# Patient Record
Sex: Female | Born: 1960 | Hispanic: Yes | Marital: Married | Smoking: Never smoker
Health system: Southern US, Community
[De-identification: ages and names within clinical notes are randomized; demographics above are authoritative.]

## PROBLEM LIST (undated history)

## (undated) DIAGNOSIS — I10 Essential (primary) hypertension: Secondary | ICD-10-CM

## (undated) DIAGNOSIS — M1990 Unspecified osteoarthritis, unspecified site: Secondary | ICD-10-CM

## (undated) DIAGNOSIS — E079 Disorder of thyroid, unspecified: Secondary | ICD-10-CM

## (undated) DIAGNOSIS — H6690 Otitis media, unspecified, unspecified ear: Secondary | ICD-10-CM

## (undated) HISTORY — DX: Unspecified osteoarthritis, unspecified site: M19.90

## (undated) HISTORY — PX: BREAST EXCISIONAL BIOPSY: SUR124

## (undated) HISTORY — DX: Essential (primary) hypertension: I10

## (undated) HISTORY — PX: KNEE SURGERY: SHX244

## (undated) HISTORY — DX: Otitis media, unspecified, unspecified ear: H66.90

## (undated) HISTORY — DX: Disorder of thyroid, unspecified: E07.9

---

## 2001-07-14 ENCOUNTER — Other Ambulatory Visit: Admission: RE | Admit: 2001-07-14 | Discharge: 2001-07-14 | Payer: Self-pay | Admitting: *Deleted

## 2002-07-06 ENCOUNTER — Other Ambulatory Visit: Admission: RE | Admit: 2002-07-06 | Discharge: 2002-07-06 | Payer: Self-pay | Admitting: *Deleted

## 2003-09-26 ENCOUNTER — Other Ambulatory Visit: Admission: RE | Admit: 2003-09-26 | Discharge: 2003-09-26 | Payer: Self-pay | Admitting: *Deleted

## 2003-10-13 ENCOUNTER — Encounter: Admission: RE | Admit: 2003-10-13 | Discharge: 2003-10-13 | Payer: Self-pay | Admitting: Internal Medicine

## 2004-11-14 ENCOUNTER — Encounter: Admission: RE | Admit: 2004-11-14 | Discharge: 2004-11-14 | Payer: Self-pay | Admitting: Internal Medicine

## 2006-01-02 ENCOUNTER — Encounter: Admission: RE | Admit: 2006-01-02 | Discharge: 2006-01-02 | Payer: Self-pay | Admitting: Internal Medicine

## 2007-03-16 ENCOUNTER — Encounter: Admission: RE | Admit: 2007-03-16 | Discharge: 2007-03-16 | Payer: Self-pay | Admitting: Internal Medicine

## 2007-03-17 ENCOUNTER — Ambulatory Visit: Payer: Self-pay | Admitting: Internal Medicine

## 2007-03-25 ENCOUNTER — Encounter: Admission: RE | Admit: 2007-03-25 | Discharge: 2007-03-25 | Payer: Self-pay | Admitting: Internal Medicine

## 2008-03-18 ENCOUNTER — Encounter: Admission: RE | Admit: 2008-03-18 | Discharge: 2008-03-18 | Payer: Self-pay | Admitting: Internal Medicine

## 2008-06-24 HISTORY — PX: JOINT REPLACEMENT: SHX530

## 2009-03-20 ENCOUNTER — Encounter: Admission: RE | Admit: 2009-03-20 | Discharge: 2009-03-20 | Payer: Self-pay | Admitting: Internal Medicine

## 2009-05-08 ENCOUNTER — Ambulatory Visit: Payer: Self-pay | Admitting: Gastroenterology

## 2010-03-29 ENCOUNTER — Encounter: Admission: RE | Admit: 2010-03-29 | Discharge: 2010-03-29 | Payer: Self-pay | Admitting: Internal Medicine

## 2010-07-16 ENCOUNTER — Encounter: Payer: Self-pay | Admitting: Internal Medicine

## 2011-01-09 ENCOUNTER — Other Ambulatory Visit: Payer: Self-pay | Admitting: Obstetrics and Gynecology

## 2011-01-09 DIAGNOSIS — R928 Other abnormal and inconclusive findings on diagnostic imaging of breast: Secondary | ICD-10-CM

## 2011-01-14 ENCOUNTER — Ambulatory Visit
Admission: RE | Admit: 2011-01-14 | Discharge: 2011-01-14 | Disposition: A | Discharge status: Home / Self Care | Payer: BC Managed Care – PPO | Source: Ambulatory Visit | Attending: Obstetrics and Gynecology | Admitting: Obstetrics and Gynecology

## 2011-01-14 ENCOUNTER — Other Ambulatory Visit: Payer: Self-pay | Admitting: Obstetrics and Gynecology

## 2011-01-14 DIAGNOSIS — R928 Other abnormal and inconclusive findings on diagnostic imaging of breast: Secondary | ICD-10-CM

## 2011-01-17 ENCOUNTER — Other Ambulatory Visit: Payer: Self-pay | Admitting: Obstetrics and Gynecology

## 2011-01-17 ENCOUNTER — Ambulatory Visit
Admission: RE | Admit: 2011-01-17 | Discharge: 2011-01-17 | Disposition: A | Discharge status: Home / Self Care | Payer: BC Managed Care – PPO | Source: Ambulatory Visit | Attending: Obstetrics and Gynecology | Admitting: Obstetrics and Gynecology

## 2011-01-17 DIAGNOSIS — R928 Other abnormal and inconclusive findings on diagnostic imaging of breast: Secondary | ICD-10-CM

## 2011-01-18 ENCOUNTER — Ambulatory Visit
Admission: RE | Admit: 2011-01-18 | Discharge: 2011-01-18 | Disposition: A | Discharge status: Home / Self Care | Payer: BC Managed Care – PPO | Source: Ambulatory Visit | Attending: Obstetrics and Gynecology | Admitting: Obstetrics and Gynecology

## 2011-01-18 DIAGNOSIS — R928 Other abnormal and inconclusive findings on diagnostic imaging of breast: Secondary | ICD-10-CM

## 2011-01-29 ENCOUNTER — Encounter (INDEPENDENT_AMBULATORY_CARE_PROVIDER_SITE_OTHER): Payer: Self-pay | Admitting: Surgery

## 2011-01-29 ENCOUNTER — Other Ambulatory Visit (INDEPENDENT_AMBULATORY_CARE_PROVIDER_SITE_OTHER): Payer: Self-pay | Admitting: Surgery

## 2011-01-29 ENCOUNTER — Ambulatory Visit (INDEPENDENT_AMBULATORY_CARE_PROVIDER_SITE_OTHER): Payer: BC Managed Care – PPO | Admitting: Surgery

## 2011-01-29 VITALS — BP 158/110 | HR 70 | Temp 97.6°F | Ht 63.0 in | Wt 186.6 lb

## 2011-01-29 DIAGNOSIS — N6099 Unspecified benign mammary dysplasia of unspecified breast: Secondary | ICD-10-CM

## 2011-01-29 DIAGNOSIS — R921 Mammographic calcification found on diagnostic imaging of breast: Secondary | ICD-10-CM

## 2011-01-29 DIAGNOSIS — N6089 Other benign mammary dysplasias of unspecified breast: Secondary | ICD-10-CM

## 2011-01-29 NOTE — Progress Notes (Signed)
Julia Diaz is a 50 y.o. female.    Chief Complaint  Patient presents with  . 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 I am asked to consider her for excision of this area. She has no complaints. She has had no previous history of breast biopsies or breast cancer. She denies nipple discharge. Her family history is negative for breast cancer Past Medical History  Diagnosis Date  . Hypertension   . Thyroid disease   . Arthritis   . Ear infection     Past Surgical History  Procedure Date  . Knee surgery     right    History reviewed. No pertinent family history.  Social History History  Substance Use Topics  . Smoking status: Never Smoker   . Smokeless tobacco: Not on file  . Alcohol Use: Yes     socially    Allergies  Allergen Reactions  . Aspirin Swelling    Face, eyes. Patient does not remember if breathing was affected.    Current Outpatient Prescriptions  Medication Sig Dispense Refill  . levothyroxine (SYNTHROID, LEVOTHROID) 75 MCG tablet Take 75 mcg by mouth daily.          Review of Systems ROS A 16 point review of systems was reviewed with the patient and her daughter. It is negative from a cardiopulmonary standpoint and is otherwise generally negative as well.Physical Exam Physical Exam  Constitutional: She is oriented to person, place, and time. She appears well-developed and well-nourished. No distress.  HENT:  Head: Normocephalic and atraumatic.  Right Ear: External ear normal.  Left Ear: External ear normal.  Nose: Nose normal.  Mouth/Throat: Oropharynx is clear and moist. No oropharyngeal exudate.  Eyes: Conjunctivae are normal. Pupils are equal, round, and reactive to light.  Neck: Normal range of motion. Neck supple. No tracheal deviation present. No thyromegaly present.  Cardiovascular: Normal  rate, regular rhythm, normal heart sounds and intact distal pulses.   No murmur heard. Respiratory: Effort normal and breath sounds normal. She has no wheezes.  GI: She exhibits no distension. There is no tenderness.  Musculoskeletal: Normal range of motion. She exhibits no edema and no tenderness.  Lymphadenopathy:    She has no axillary adenopathy.       Right axillary: No lateral adenopathy present.       Left axillary: No lateral adenopathy present. Neurological: She is alert and oriented to person, place, and time.  Skin: Skin is warm and dry. No rash noted. No erythema.  Psychiatric: Her behavior is normal. Judgment normal.    On breast exam, there are no palpable masses in either breast. Her biopsy site in the left breast is mildly bruised.  Pathology results showed atypical ductal hyperplasia and microcalcifications Blood pressure 158/110, pulse 70, temperature 97.6 F (36.4 C), temperature source Temporal, height 5\' 3"  (1.6 m), weight 186 lb 9.6 oz (84.641 kg).  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 Needle localized lumpectomy is recommended of this area to rule out malignancy. I discussed the risk of surgery which include but not limited to bleeding, infection, injury to surrounding structures, findings of malignancy with need for further surgery, et Karie Soda. Surgery will reschedule. Kaizer Dissinger A 01/29/2011, 10:57 AM

## 2011-02-11 ENCOUNTER — Encounter (HOSPITAL_COMMUNITY)
Admission: RE | Admit: 2011-02-11 | Discharge: 2011-02-11 | Disposition: A | Discharge status: Home / Self Care | Payer: BC Managed Care – PPO | Source: Ambulatory Visit | Attending: Surgery | Admitting: Surgery

## 2011-02-11 LAB — BASIC METABOLIC PANEL
BUN: 16 mg/dL (ref 6–23)
Calcium: 9.8 mg/dL (ref 8.4–10.5)
Creatinine, Ser: 0.66 mg/dL (ref 0.50–1.10)
GFR calc Af Amer: >60 mL/min (ref >60)
GFR calc non Af Amer: >60 mL/min (ref >60)
Glucose, Bld: 103 mg/dL — ABNORMAL HIGH (ref 70–99)
Potassium: 4 mEq/L (ref 3.5–5.1)

## 2011-02-11 LAB — CBC
HCT: 40.8 % (ref 36.0–46.0)
Hemoglobin: 14 g/dL (ref 12.0–15.0)
MCH: 31 pg (ref 26.0–34.0)
MCHC: 34.3 g/dL (ref 30.0–36.0)
RDW: 12.7 % (ref 11.5–15.5)

## 2011-02-22 ENCOUNTER — Ambulatory Visit
Admission: RE | Admit: 2011-02-22 | Discharge: 2011-02-22 | Disposition: A | Discharge status: Home / Self Care | Payer: BC Managed Care – PPO | Source: Ambulatory Visit | Attending: Surgery | Admitting: Surgery

## 2011-02-22 ENCOUNTER — Ambulatory Visit (HOSPITAL_COMMUNITY)
Admission: RE | Admit: 2011-02-22 | Discharge: 2011-02-22 | Disposition: A | Discharge status: Home / Self Care | Payer: BC Managed Care – PPO | Source: Ambulatory Visit | Attending: Surgery | Admitting: Surgery

## 2011-02-22 ENCOUNTER — Inpatient Hospital Stay: Admission: RE | Admit: 2011-02-22 | Payer: BC Managed Care – PPO | Source: Ambulatory Visit

## 2011-02-22 ENCOUNTER — Other Ambulatory Visit (INDEPENDENT_AMBULATORY_CARE_PROVIDER_SITE_OTHER): Payer: Self-pay | Admitting: Surgery

## 2011-02-22 DIAGNOSIS — R920 Mammographic microcalcification found on diagnostic imaging of breast: Secondary | ICD-10-CM

## 2011-02-22 DIAGNOSIS — N6089 Other benign mammary dysplasias of unspecified breast: Secondary | ICD-10-CM | POA: Insufficient documentation

## 2011-02-22 DIAGNOSIS — R921 Mammographic calcification found on diagnostic imaging of breast: Secondary | ICD-10-CM

## 2011-02-22 DIAGNOSIS — Z01812 Encounter for preprocedural laboratory examination: Secondary | ICD-10-CM | POA: Insufficient documentation

## 2011-02-22 HISTORY — PX: BREAST SURGERY: SHX581

## 2011-02-26 ENCOUNTER — Telehealth (INDEPENDENT_AMBULATORY_CARE_PROVIDER_SITE_OTHER): Payer: Self-pay | Admitting: Surgery

## 2011-03-01 NOTE — Op Note (Signed)
  NAMEIDALIA, ALLBRITTON NO.:  [id number]  MEDICAL RECORD NO.:  [id number]  LOCATION:                                 FACILITY:  PHYSICIAN:  Abigail Miyamoto, M.D. DATE OF BIRTH:  Nov 11, 1960  DATE OF PROCEDURE:  02/22/2011 DATE OF DISCHARGE:                              OPERATIVE REPORT   PREOPERATIVE DIAGNOSIS:  Abnormal microcalcifications with atypical ductal hyperplasia of the left breast.  POSTOPERATIVE DIAGNOSIS:  Abnormal microcalcifications with atypical ductal hyperplasia of the left breast.  PROCEDURE:  Needle-localized left breast biopsy.  SURGEON:  Abigail Miyamoto, MD  ANESTHESIA:  General and 0.25% Marcaine.  ESTIMATED BLOOD LOSS:  Minimal.  INDICATIONS:  This is a 50 year old female who presented with abnormal microcalcifications of the left breast.  Stereotactic biopsy showed atypical ductal hyperplasia.  Decision was made to proceed with needle- localized removal of this area.  PROCEDURE IN DETAIL:  The patient was brought to the operating room and identified as Marilu Simao.  She was placed supine on the operating table and anesthesia was induced.  She had already had needle localization wire placed in the left breast.  Her left breast was then prepped and draped in usual sterile fashion.  I made a small incision at 12 o'clock position of the left breast incorporating the localization wire.  I then took this down to the breast tissue with electrocautery. I then performed an excision of the surrounding breast tissue around localization wire going down to the chest wall.  Once the mass was completely removed, it was x-rayed in the suspicious area and clipped and confirmed to be in the biopsy specimen.  I then irrigated the wound with saline and achieved hemostasis with cautery.  I anesthetized the area with Marcaine and closed the incision with interrupted 3-0 Vicryl sutures and a running 4-0 Monocryl.  Steri-Strips, gauze and  Tegaderm were then applied.  The patient tolerated the procedure well.  All counts were correct at the end of the procedure.  The patient was then extubated in the operating room and taken in stable condition to recovery room.     Abigail Miyamoto, M.D.     DB/MEDQ  D:  02/22/2011  T:  02/22/2011  Job:  [id number]  Electronically Signed by Abigail Miyamoto M.D. on 03/01/2011 12:37:23 PM

## 2011-03-08 ENCOUNTER — Encounter (INDEPENDENT_AMBULATORY_CARE_PROVIDER_SITE_OTHER): Payer: Self-pay | Admitting: Surgery

## 2011-03-08 ENCOUNTER — Ambulatory Visit (INDEPENDENT_AMBULATORY_CARE_PROVIDER_SITE_OTHER): Payer: BC Managed Care – PPO | Admitting: Surgery

## 2011-03-08 VITALS — BP 152/94 | HR 72

## 2011-03-08 DIAGNOSIS — Z09 Encounter for follow-up examination after completed treatment for conditions other than malignant neoplasm: Secondary | ICD-10-CM

## 2011-03-08 NOTE — Progress Notes (Signed)
Subjective:     Patient ID: Julia Diaz, female   DOB: 1960-08-14, 50 y.o.   MRN: [id number]  HPI She is here for her first postoperative visit status post excision of a left breast mass. She has no complaints.  Review of Systems     Objective:   Physical Exam On exam the incision is healing well without evidence of infection.  The final pathology showed no evidence of malignancy and a biopsy specimen from the previous atypical area.    Assessment:     Patient status post excision of a benign breast mass after atypical cells found on stereotactic biopsy    Plan:     I reviewed the pathology with her. She will continue her self examinations and yearly mammograms. I will see her back as needed.

## 2011-05-11 ENCOUNTER — Emergency Department: Payer: Self-pay | Admitting: Unknown Physician Specialty

## 2011-05-14 ENCOUNTER — Other Ambulatory Visit: Payer: Self-pay | Admitting: Internal Medicine

## 2011-05-17 ENCOUNTER — Other Ambulatory Visit: Payer: Self-pay | Admitting: Internal Medicine

## 2011-05-17 DIAGNOSIS — N63 Unspecified lump in unspecified breast: Secondary | ICD-10-CM

## 2011-05-20 ENCOUNTER — Ambulatory Visit
Admission: RE | Admit: 2011-05-20 | Discharge: 2011-05-20 | Disposition: A | Discharge status: Home / Self Care | Payer: BC Managed Care – PPO | Source: Ambulatory Visit | Attending: Internal Medicine | Admitting: Internal Medicine

## 2011-05-20 ENCOUNTER — Other Ambulatory Visit: Payer: Self-pay | Admitting: Internal Medicine

## 2011-05-20 DIAGNOSIS — N63 Unspecified lump in unspecified breast: Secondary | ICD-10-CM

## 2012-01-16 ENCOUNTER — Other Ambulatory Visit: Payer: Self-pay | Admitting: Internal Medicine

## 2012-01-16 DIAGNOSIS — Z1231 Encounter for screening mammogram for malignant neoplasm of breast: Secondary | ICD-10-CM

## 2012-02-18 ENCOUNTER — Ambulatory Visit
Admission: RE | Admit: 2012-02-18 | Discharge: 2012-02-18 | Disposition: A | Discharge status: Home / Self Care | Payer: 59 | Source: Ambulatory Visit | Attending: Internal Medicine | Admitting: Internal Medicine

## 2012-02-18 ENCOUNTER — Ambulatory Visit: Payer: BC Managed Care – PPO

## 2012-02-18 DIAGNOSIS — Z1231 Encounter for screening mammogram for malignant neoplasm of breast: Secondary | ICD-10-CM

## 2013-01-07 ENCOUNTER — Other Ambulatory Visit: Payer: Self-pay

## 2013-01-07 DIAGNOSIS — Z1231 Encounter for screening mammogram for malignant neoplasm of breast: Secondary | ICD-10-CM

## 2013-01-29 ENCOUNTER — Ambulatory Visit
Admission: RE | Admit: 2013-01-29 | Discharge: 2013-01-29 | Disposition: A | Discharge status: Home / Self Care | Payer: 59 | Source: Ambulatory Visit

## 2013-01-29 DIAGNOSIS — Z1231 Encounter for screening mammogram for malignant neoplasm of breast: Secondary | ICD-10-CM

## 2013-03-07 IMAGING — MG MM DIGITAL DIAGNOSTIC UNILAT L {BCG}
4 series · 4 of 4 positions shown · non-contrast
Comparison: With priors

CLINICAL DATA: Abnormal left screening mammogram

DIGITAL DIAGNOSTIC LEFT MAMMOGRAM

[L CC]
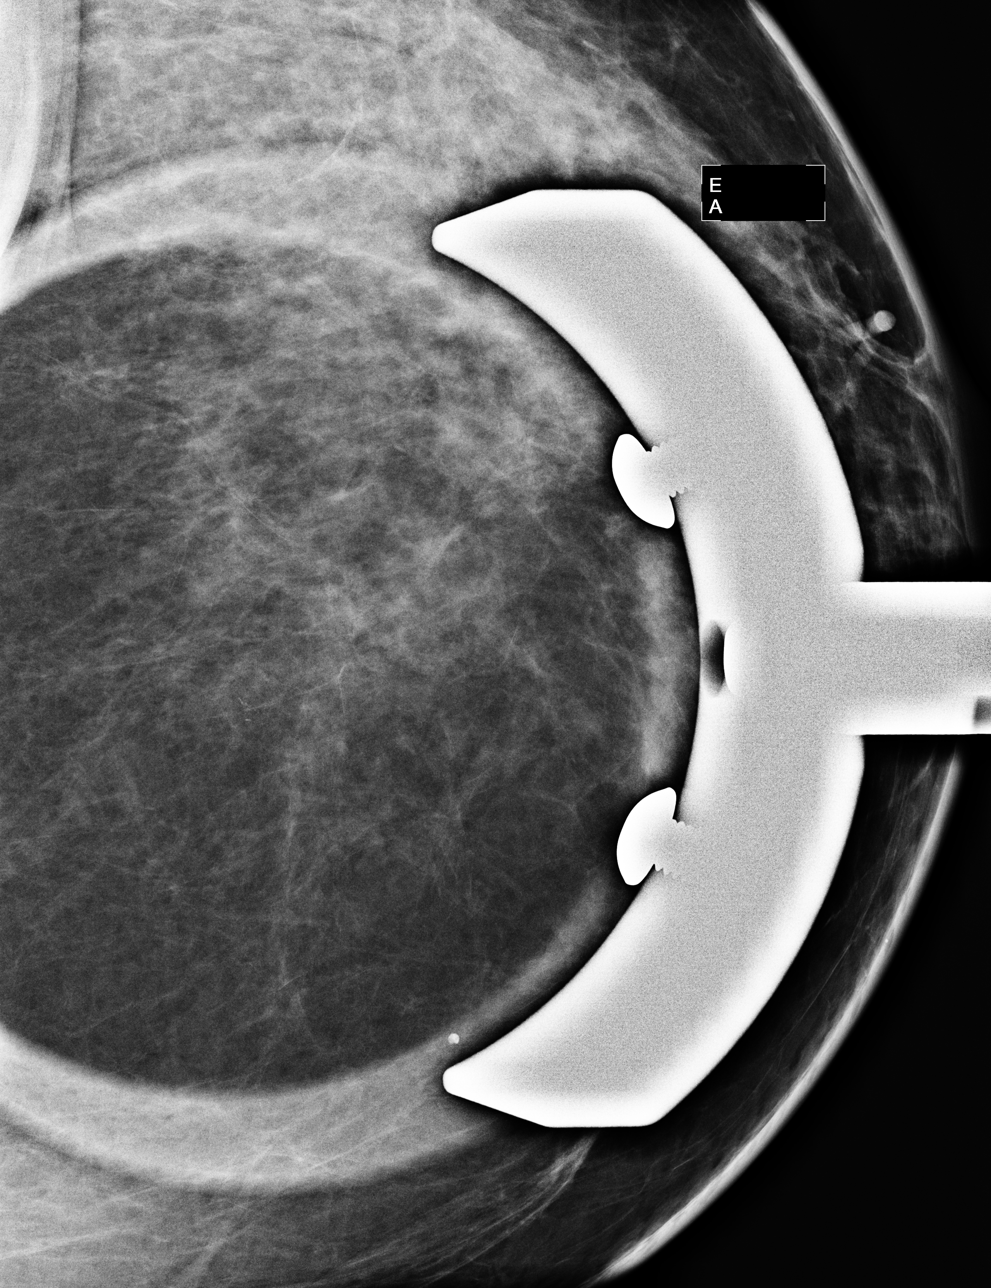

[L ML (1 of 3)]
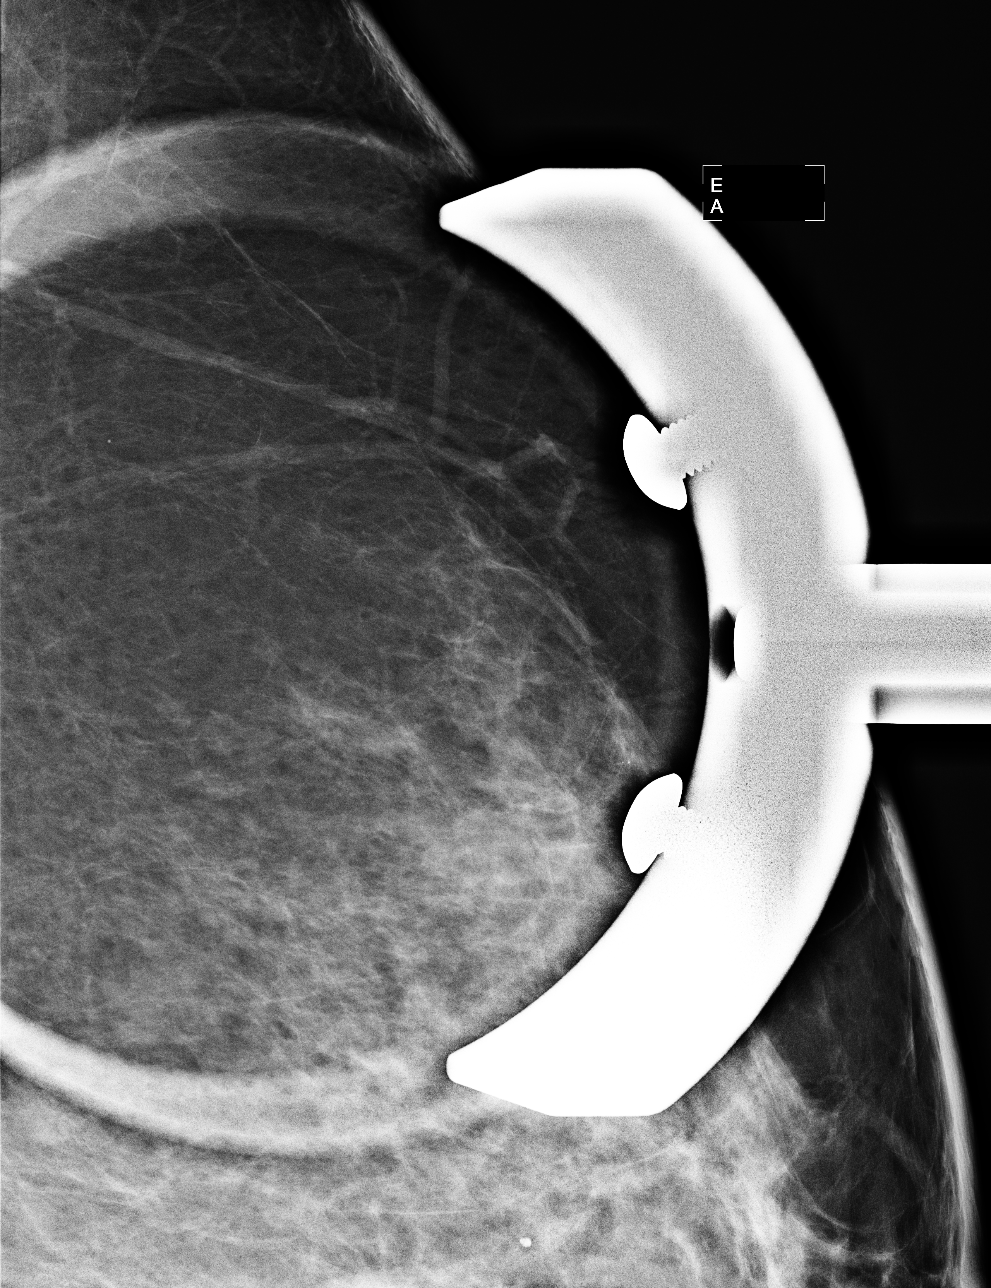

[L ML (2 of 3)]
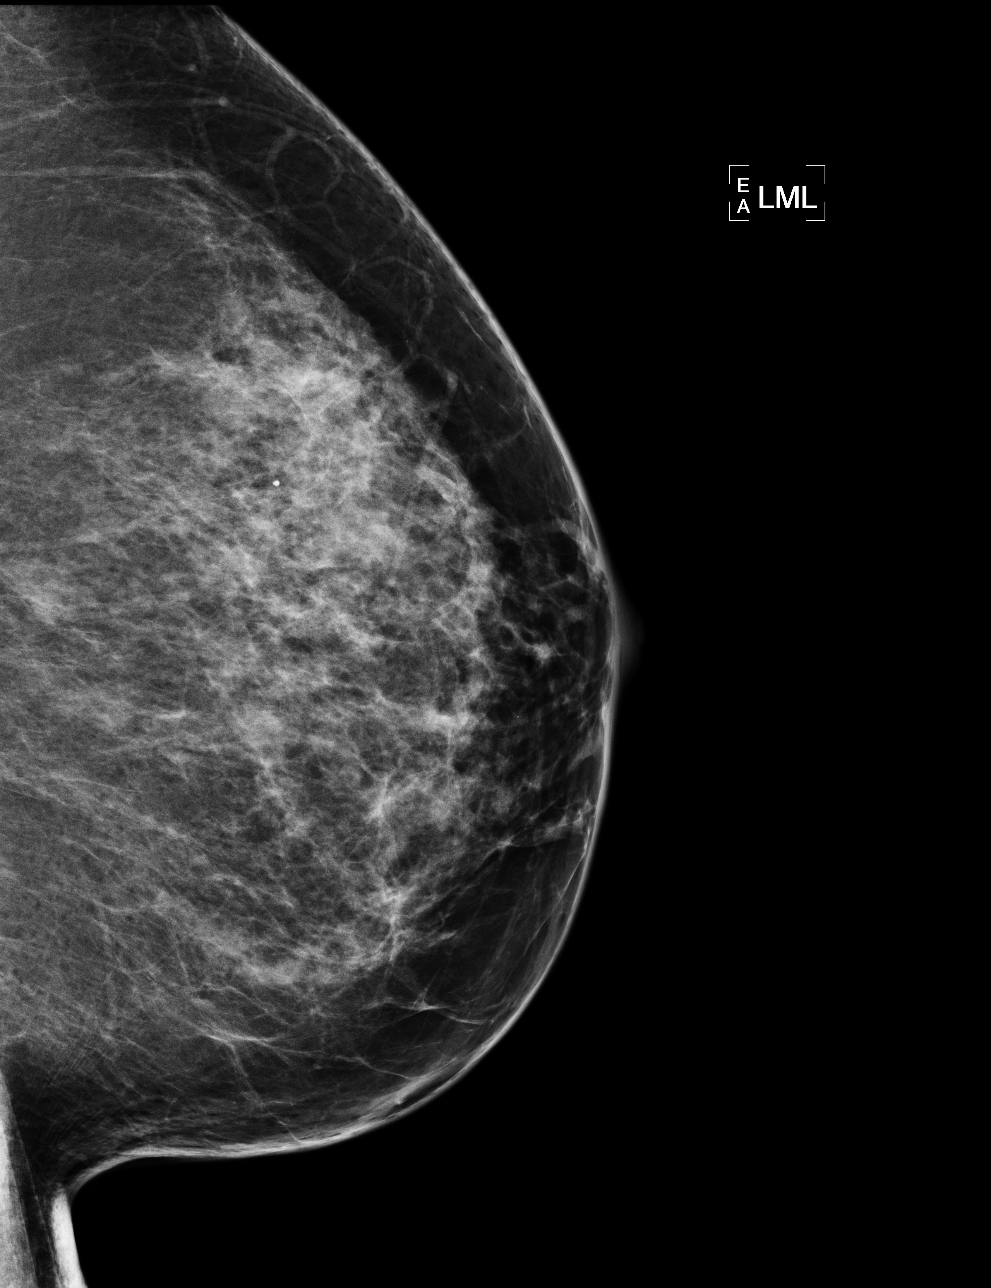

[L ML (3 of 3)]
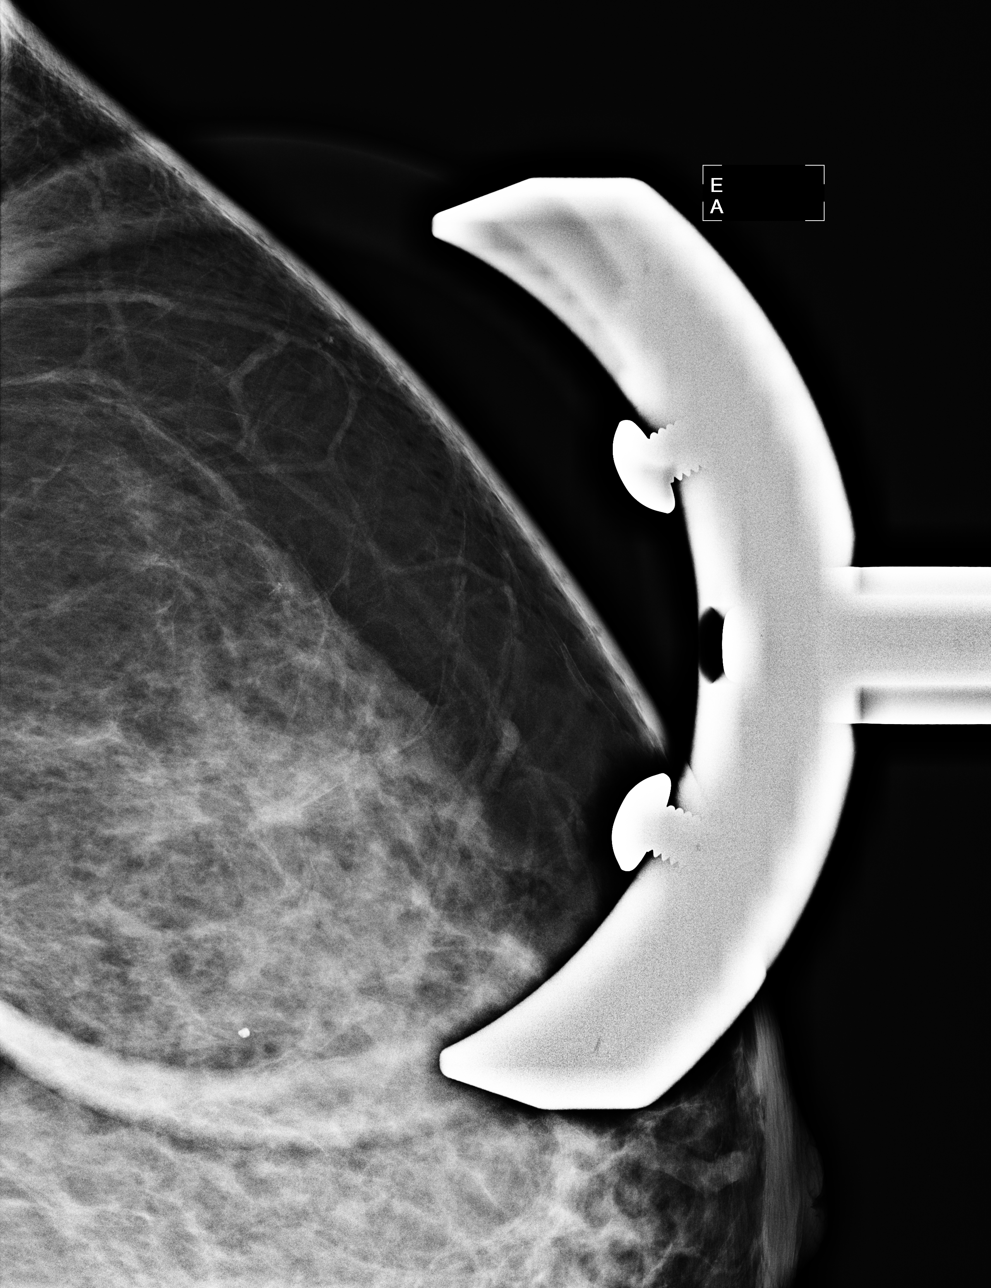

[4 of 4 positions shown; findings below may reference images not displayed]

FINDINGS: Magnification views of the upper inner quadrant of the
left breast were obtained.  There is a cluster of calcifications
spanning an area of 4 mm. Calcifications appear to be branching,
therefore suspicious.  There is no associated mass.
IMPRESSION: Suspicious left breast calcifications.  Tissue sampling is
recommended.  A stereotactic biopsy will be scheduled at the
patient's convenience.

BI-RADS CATEGORY 4:  Suspicious abnormality - biopsy should be
considered.

## 2013-11-17 ENCOUNTER — Other Ambulatory Visit: Payer: Self-pay

## 2013-11-17 DIAGNOSIS — Z1231 Encounter for screening mammogram for malignant neoplasm of breast: Secondary | ICD-10-CM

## 2014-01-31 ENCOUNTER — Ambulatory Visit
Admission: RE | Admit: 2014-01-31 | Discharge: 2014-01-31 | Disposition: A | Discharge status: Home / Self Care | Payer: 59 | Source: Ambulatory Visit

## 2014-01-31 DIAGNOSIS — Z1231 Encounter for screening mammogram for malignant neoplasm of breast: Secondary | ICD-10-CM

## 2015-02-07 ENCOUNTER — Other Ambulatory Visit: Payer: Self-pay

## 2015-02-07 DIAGNOSIS — Z1231 Encounter for screening mammogram for malignant neoplasm of breast: Secondary | ICD-10-CM

## 2015-02-10 ENCOUNTER — Ambulatory Visit
Admission: RE | Admit: 2015-02-10 | Discharge: 2015-02-10 | Disposition: A | Discharge status: Home / Self Care | Payer: 59 | Source: Ambulatory Visit

## 2015-02-10 DIAGNOSIS — Z1231 Encounter for screening mammogram for malignant neoplasm of breast: Secondary | ICD-10-CM

## 2015-02-27 DIAGNOSIS — M5431 Sciatica, right side: Secondary | ICD-10-CM | POA: Insufficient documentation

## 2016-01-22 ENCOUNTER — Other Ambulatory Visit: Payer: Self-pay | Admitting: Internal Medicine

## 2016-01-22 DIAGNOSIS — Z1231 Encounter for screening mammogram for malignant neoplasm of breast: Secondary | ICD-10-CM

## 2016-02-13 ENCOUNTER — Ambulatory Visit
Admission: RE | Admit: 2016-02-13 | Discharge: 2016-02-13 | Disposition: A | Discharge status: Home / Self Care | Payer: 59 | Source: Ambulatory Visit | Attending: Internal Medicine | Admitting: Internal Medicine

## 2016-02-13 DIAGNOSIS — Z1231 Encounter for screening mammogram for malignant neoplasm of breast: Secondary | ICD-10-CM

## 2016-03-05 ENCOUNTER — Emergency Department: Payer: 59

## 2016-03-05 ENCOUNTER — Emergency Department: Payer: 59 | Admitting: Anesthesiology

## 2016-03-05 ENCOUNTER — Observation Stay
Admission: EM | Admit: 2016-03-05 | Discharge: 2016-03-07 | Disposition: A | Discharge status: Home / Self Care | Payer: 59 | Attending: Surgery | Admitting: Surgery

## 2016-03-05 ENCOUNTER — Encounter
Admission: EM | Disposition: A | Discharge status: Home / Self Care | Payer: Self-pay | Source: Home / Self Care | Attending: Emergency Medicine

## 2016-03-05 ENCOUNTER — Encounter: Payer: Self-pay | Admitting: Emergency Medicine

## 2016-03-05 DIAGNOSIS — M1990 Unspecified osteoarthritis, unspecified site: Secondary | ICD-10-CM | POA: Insufficient documentation

## 2016-03-05 DIAGNOSIS — K219 Gastro-esophageal reflux disease without esophagitis: Secondary | ICD-10-CM | POA: Insufficient documentation

## 2016-03-05 DIAGNOSIS — R1013 Epigastric pain: Secondary | ICD-10-CM

## 2016-03-05 DIAGNOSIS — K8050 Calculus of bile duct without cholangitis or cholecystitis without obstruction: Secondary | ICD-10-CM | POA: Diagnosis not present

## 2016-03-05 DIAGNOSIS — Z419 Encounter for procedure for purposes other than remedying health state, unspecified: Secondary | ICD-10-CM

## 2016-03-05 DIAGNOSIS — E079 Disorder of thyroid, unspecified: Secondary | ICD-10-CM | POA: Diagnosis not present

## 2016-03-05 DIAGNOSIS — I10 Essential (primary) hypertension: Secondary | ICD-10-CM | POA: Diagnosis not present

## 2016-03-05 DIAGNOSIS — K760 Fatty (change of) liver, not elsewhere classified: Secondary | ICD-10-CM | POA: Diagnosis not present

## 2016-03-05 DIAGNOSIS — K8060 Calculus of gallbladder and bile duct with cholecystitis, unspecified, without obstruction: Principal | ICD-10-CM | POA: Insufficient documentation

## 2016-03-05 DIAGNOSIS — Z886 Allergy status to analgesic agent status: Secondary | ICD-10-CM | POA: Insufficient documentation

## 2016-03-05 DIAGNOSIS — K810 Acute cholecystitis: Secondary | ICD-10-CM | POA: Diagnosis present

## 2016-03-05 HISTORY — PX: CHOLECYSTECTOMY: SHX55

## 2016-03-05 LAB — COMPREHENSIVE METABOLIC PANEL
ALBUMIN: 4.1 g/dL (ref 3.5–5.0)
ALK PHOS: 89 U/L (ref 38–126)
ALT: 138 U/L — AB (ref 14–54)
AST: 184 U/L — AB (ref 15–41)
Anion gap: 6 (ref 5–15)
BILIRUBIN TOTAL: 0.7 mg/dL (ref 0.3–1.2)
BUN: 22 mg/dL — AB (ref 6–20)
CALCIUM: 8.9 mg/dL (ref 8.9–10.3)
CO2: 25 mmol/L (ref 22–32)
CREATININE: 0.69 mg/dL (ref 0.44–1.00)
Chloride: 104 mmol/L (ref 101–111)
GFR calc Af Amer: >60 mL/min (ref >60)
GFR calc non Af Amer: >60 mL/min (ref >60)
GLUCOSE: 114 mg/dL — AB (ref 65–99)
Potassium: 3.8 mmol/L (ref 3.5–5.1)
Sodium: 135 mmol/L (ref 135–145)
TOTAL PROTEIN: 7.6 g/dL (ref 6.5–8.1)

## 2016-03-05 LAB — URINALYSIS COMPLETE WITH MICROSCOPIC (ARMC ONLY)
BACTERIA UA: NONE SEEN
Bilirubin Urine: NEGATIVE
Glucose, UA: NEGATIVE mg/dL
Ketones, ur: NEGATIVE mg/dL
NITRITE: NEGATIVE
PROTEIN: NEGATIVE mg/dL
SPECIFIC GRAVITY, URINE: 1.023 (ref 1.005–1.030)
pH: 6 (ref 5.0–8.0)

## 2016-03-05 LAB — CBC WITH DIFFERENTIAL/PLATELET
BASOS ABS: 0 10*3/uL (ref 0–0.1)
BASOS PCT: 0 %
Eosinophils Absolute: 0.1 10*3/uL (ref 0–0.7)
Eosinophils Relative: 1 %
HEMATOCRIT: 40 % (ref 35.0–47.0)
HEMOGLOBIN: 13.9 g/dL (ref 12.0–16.0)
LYMPHS PCT: 15 %
Lymphs Abs: 1.2 10*3/uL (ref 1.0–3.6)
MCH: 31.6 pg (ref 26.0–34.0)
MCHC: 34.7 g/dL (ref 32.0–36.0)
MCV: 91.1 fL (ref 80.0–100.0)
Monocytes Absolute: 0.4 10*3/uL (ref 0.2–0.9)
Monocytes Relative: 5 %
NEUTROS ABS: 6.5 10*3/uL (ref 1.4–6.5)
NEUTROS PCT: 79 %
Platelets: 210 10*3/uL (ref 150–440)
RBC: 4.39 MIL/uL (ref 3.80–5.20)
RDW: 13 % (ref 11.5–14.5)
WBC: 8.2 10*3/uL (ref 3.6–11.0)

## 2016-03-05 LAB — LIPASE, BLOOD: Lipase: 21 U/L (ref 11–51)

## 2016-03-05 LAB — TROPONIN I: Troponin I: <0.03 ng/mL (ref <0.03)

## 2016-03-05 SURGERY — LAPAROSCOPIC CHOLECYSTECTOMY WITH INTRAOPERATIVE CHOLANGIOGRAM
Anesthesia: General | Wound class: Clean Contaminated

## 2016-03-05 MED ORDER — MEPERIDINE HCL 25 MG/ML IJ SOLN
INTRAMUSCULAR | Status: AC
  Administered 2016-03-05: 12.5 mg via INTRAVENOUS
  Filled 2016-03-05: qty 1

## 2016-03-05 MED ORDER — FENTANYL CITRATE (PF) 100 MCG/2ML IJ SOLN
INTRAMUSCULAR | Status: DC | PRN
  Administered 2016-03-05: 100 ug via INTRAVENOUS

## 2016-03-05 MED ORDER — HYDROCODONE-ACETAMINOPHEN 5-325 MG PO TABS
1.0000 | ORAL_TABLET | ORAL | Status: DC | PRN
  Administered 2016-03-05: 1 via ORAL
  Administered 2016-03-05 – 2016-03-07 (×7): 2 via ORAL
  Filled 2016-03-05 (×6): qty 2
  Filled 2016-03-05: qty 1
  Filled 2016-03-05: qty 2

## 2016-03-05 MED ORDER — LACTATED RINGERS IV SOLN
INTRAVENOUS | Status: DC
Start: 2016-03-05 — End: 2016-03-06
  Administered 2016-03-05 (×3): via INTRAVENOUS

## 2016-03-05 MED ORDER — MIDAZOLAM HCL 2 MG/2ML IJ SOLN
INTRAMUSCULAR | Status: DC | PRN
  Administered 2016-03-05: 2 mg via INTRAVENOUS

## 2016-03-05 MED ORDER — IRBESARTAN 150 MG PO TABS
75.0000 mg | ORAL_TABLET | Freq: Every day | ORAL | Status: DC
  Administered 2016-03-06 – 2016-03-07 (×2): 75 mg via ORAL
  Filled 2016-03-05: qty 1
  Filled 2016-03-05: qty 2

## 2016-03-05 MED ORDER — FENTANYL CITRATE (PF) 100 MCG/2ML IJ SOLN
25.0000 ug | INTRAMUSCULAR | Status: DC | PRN
Start: 2016-03-05 — End: 2016-03-05
  Administered 2016-03-05 (×4): 25 ug via INTRAVENOUS

## 2016-03-05 MED ORDER — ONDANSETRON HCL 4 MG/2ML IJ SOLN
4.0000 mg | Freq: Once | INTRAMUSCULAR | Status: AC
  Administered 2016-03-05: 4 mg via INTRAVENOUS
  Filled 2016-03-05: qty 2

## 2016-03-05 MED ORDER — MEPERIDINE HCL 25 MG/ML IJ SOLN
12.5000 mg | Freq: Two times a day (BID) | INTRAMUSCULAR | Status: DC | PRN
  Administered 2016-03-05 (×2): 12.5 mg via INTRAVENOUS

## 2016-03-05 MED ORDER — ONDANSETRON HCL 4 MG/2ML IJ SOLN: 4.0000 mg | Freq: Once | INTRAMUSCULAR | Status: DC | PRN

## 2016-03-05 MED ORDER — GLUCAGON HCL RDNA (DIAGNOSTIC) 1 MG IJ SOLR
INTRAMUSCULAR | Status: DC | PRN
  Administered 2016-03-05: 1 mg via INTRAVENOUS

## 2016-03-05 MED ORDER — BUPIVACAINE-EPINEPHRINE (PF) 0.25% -1:200000 IJ SOLN
INTRAMUSCULAR | Status: AC
  Filled 2016-03-05: qty 30

## 2016-03-05 MED ORDER — ONDANSETRON HCL 4 MG/2ML IJ SOLN
4.0000 mg | Freq: Four times a day (QID) | INTRAMUSCULAR | Status: DC | PRN
  Administered 2016-03-06: 4 mg via INTRAVENOUS
  Filled 2016-03-05: qty 2

## 2016-03-05 MED ORDER — ONDANSETRON HCL 4 MG PO TABS: 4.0000 mg | ORAL_TABLET | Freq: Four times a day (QID) | ORAL | Status: DC | PRN

## 2016-03-05 MED ORDER — BUPIVACAINE-EPINEPHRINE (PF) 0.25% -1:200000 IJ SOLN
INTRAMUSCULAR | Status: DC | PRN
  Administered 2016-03-05: 23 mL via PERINEURAL

## 2016-03-05 MED ORDER — ONDANSETRON HCL 4 MG/2ML IJ SOLN
INTRAMUSCULAR | Status: DC | PRN
  Administered 2016-03-05: 4 mg via INTRAVENOUS

## 2016-03-05 MED ORDER — FENTANYL CITRATE (PF) 100 MCG/2ML IJ SOLN
INTRAMUSCULAR | Status: AC
  Administered 2016-03-05: 25 ug via INTRAVENOUS
  Filled 2016-03-05: qty 2

## 2016-03-05 MED ORDER — HEPARIN SODIUM (PORCINE) 5000 UNIT/ML IJ SOLN
5000.0000 [IU] | Freq: Three times a day (TID) | INTRAMUSCULAR | Status: DC
  Administered 2016-03-05 – 2016-03-07 (×6): 5000 [IU] via SUBCUTANEOUS
  Filled 2016-03-05 (×6): qty 1

## 2016-03-05 MED ORDER — ROCURONIUM BROMIDE 100 MG/10ML IV SOLN
INTRAVENOUS | Status: DC | PRN
  Administered 2016-03-05: 30 mg via INTRAVENOUS

## 2016-03-05 MED ORDER — LIDOCAINE HCL (CARDIAC) 20 MG/ML IV SOLN
INTRAVENOUS | Status: DC | PRN
  Administered 2016-03-05: 100 mg via INTRAVENOUS

## 2016-03-05 MED ORDER — CEFAZOLIN IN D5W 1 GM/50ML IV SOLN
1.0000 g | Freq: Four times a day (QID) | INTRAVENOUS | Status: DC
  Administered 2016-03-05 – 2016-03-07 (×8): 1 g via INTRAVENOUS
  Filled 2016-03-05 (×14): qty 50

## 2016-03-05 MED ORDER — SODIUM CHLORIDE 0.9 % IV BOLUS (SEPSIS)
1000.0000 mL | Freq: Once | INTRAVENOUS | Status: AC
  Administered 2016-03-05: 1000 mL via INTRAVENOUS

## 2016-03-05 MED ORDER — MORPHINE SULFATE (PF) 2 MG/ML IV SOLN
2.0000 mg | INTRAVENOUS | Status: DC | PRN
  Administered 2016-03-05: 2 mg via INTRAVENOUS
  Filled 2016-03-05: qty 1

## 2016-03-05 MED ORDER — CEFAZOLIN SODIUM-DEXTROSE 2-4 GM/100ML-% IV SOLN
INTRAVENOUS | Status: AC
  Filled 2016-03-05: qty 100

## 2016-03-05 MED ORDER — GLUCAGON HCL RDNA (DIAGNOSTIC) 1 MG IJ SOLR
INTRAMUSCULAR | Status: AC
  Filled 2016-03-05: qty 1

## 2016-03-05 MED ORDER — HEPARIN SODIUM (PORCINE) 5000 UNIT/ML IJ SOLN
INTRAMUSCULAR | Status: AC
  Administered 2016-03-05: 5000 [IU] via SUBCUTANEOUS
  Filled 2016-03-05: qty 1

## 2016-03-05 MED ORDER — PROPOFOL 10 MG/ML IV BOLUS
INTRAVENOUS | Status: DC | PRN
  Administered 2016-03-05: 160 mg via INTRAVENOUS

## 2016-03-05 MED ORDER — IOTHALAMATE MEGLUMINE 60 % INJ SOLN
INTRAMUSCULAR | Status: DC | PRN
  Administered 2016-03-05: 20 mL

## 2016-03-05 MED ORDER — SUGAMMADEX SODIUM 200 MG/2ML IV SOLN
INTRAVENOUS | Status: DC | PRN
  Administered 2016-03-05: 170 mg via INTRAVENOUS

## 2016-03-05 MED ORDER — MORPHINE SULFATE (PF) 2 MG/ML IV SOLN
2.0000 mg | Freq: Once | INTRAVENOUS | Status: AC
  Administered 2016-03-05: 2 mg via INTRAVENOUS
  Filled 2016-03-05: qty 1

## 2016-03-05 MED ORDER — CEFAZOLIN SODIUM-DEXTROSE 2-4 GM/100ML-% IV SOLN
2.0000 g | Freq: Once | INTRAVENOUS | Status: AC
  Administered 2016-03-05: 2 g via INTRAVENOUS

## 2016-03-05 MED ORDER — HEPARIN SODIUM (PORCINE) 5000 UNIT/ML IJ SOLN
5000.0000 [IU] | Freq: Three times a day (TID) | INTRAMUSCULAR | Status: DC
  Administered 2016-03-05: 5000 [IU] via SUBCUTANEOUS

## 2016-03-05 MED ORDER — VALSARTAN-HYDROCHLOROTHIAZIDE 80-12.5 MG PO TABS: 1.0000 | ORAL_TABLET | Freq: Every day | ORAL | Status: DC

## 2016-03-05 MED ORDER — HYDROCHLOROTHIAZIDE 12.5 MG PO CAPS
12.5000 mg | ORAL_CAPSULE | Freq: Every day | ORAL | Status: DC
  Administered 2016-03-06 – 2016-03-07 (×2): 12.5 mg via ORAL
  Filled 2016-03-05 (×2): qty 1

## 2016-03-05 MED ORDER — LEVOTHYROXINE SODIUM 75 MCG PO TABS
75.0000 ug | ORAL_TABLET | Freq: Every day | ORAL | Status: DC
Start: 2016-03-05 — End: 2016-03-07
  Administered 2016-03-06 – 2016-03-07 (×2): 75 ug via ORAL
  Filled 2016-03-05 (×2): qty 1

## 2016-03-05 SURGICAL SUPPLY — 43 items
ADHESIVE MASTISOL STRL (MISCELLANEOUS) ×3 IMPLANT
APPLIER CLIP ROT 10 11.4 M/L (STAPLE) ×3
BLADE SURG SZ11 CARB STEEL (BLADE) ×3 IMPLANT
CANISTER SUCT 1200ML W/VALVE (MISCELLANEOUS) ×3 IMPLANT
CATH CHOLANGI 4FR 420404F (CATHETERS) IMPLANT
CHLORAPREP W/TINT 26ML (MISCELLANEOUS) ×3 IMPLANT
CLIP APPLIE ROT 10 11.4 M/L (STAPLE) ×1 IMPLANT
CLOSURE WOUND 1/2 X4 (GAUZE/BANDAGES/DRESSINGS) ×1
CONRAY 60ML FOR OR (MISCELLANEOUS) IMPLANT
DRAPE C-ARM XRAY 36X54 (DRAPES) IMPLANT
ELECT REM PT RETURN 9FT ADLT (ELECTROSURGICAL) ×3
ELECTRODE REM PT RTRN 9FT ADLT (ELECTROSURGICAL) ×1 IMPLANT
ENDOPOUCH RETRIEVER 10 (MISCELLANEOUS) IMPLANT
GAUZE SPONGE NON-WVN 2X2 STRL (MISCELLANEOUS) ×4 IMPLANT
GLOVE BIO SURGEON STRL SZ8 (GLOVE) ×3 IMPLANT
GOWN STRL REUS W/ TWL LRG LVL3 (GOWN DISPOSABLE) ×4 IMPLANT
GOWN STRL REUS W/TWL LRG LVL3 (GOWN DISPOSABLE) ×8
IRRIGATION STRYKERFLOW (MISCELLANEOUS) ×1 IMPLANT
IRRIGATOR STRYKERFLOW (MISCELLANEOUS) ×3
IV CATH ANGIO 12GX3 LT BLUE (NEEDLE) IMPLANT
IV NS 1000ML (IV SOLUTION)
IV NS 1000ML BAXH (IV SOLUTION) IMPLANT
JACKSON PRATT 10 (INSTRUMENTS) IMPLANT
KIT RM TURNOVER STRD PROC AR (KITS) ×3 IMPLANT
LABEL OR SOLS (LABEL) ×3 IMPLANT
NDL SAFETY 22GX1.5 (NEEDLE) ×3 IMPLANT
NEEDLE VERESS 14GA 120MM (NEEDLE) IMPLANT
NS IRRIG 500ML POUR BTL (IV SOLUTION) ×3 IMPLANT
PACK LAP CHOLECYSTECTOMY (MISCELLANEOUS) ×3 IMPLANT
SCISSORS METZENBAUM CVD 33 (INSTRUMENTS) ×3 IMPLANT
SLEEVE ENDOPATH XCEL 5M (ENDOMECHANICALS) ×6 IMPLANT
SPONGE EXCIL AMD DRAIN 4X4 6P (MISCELLANEOUS) IMPLANT
SPONGE LAP 18X18 5 PK (GAUZE/BANDAGES/DRESSINGS) ×3 IMPLANT
SPONGE VERSALON 2X2 STRL (MISCELLANEOUS) ×8
STRIP CLOSURE SKIN 1/2X4 (GAUZE/BANDAGES/DRESSINGS) ×2 IMPLANT
SUT MNCRL 4-0 (SUTURE) ×2
SUT MNCRL 4-0 27XMFL (SUTURE) ×1
SUT VICRYL 0 AB UR-6 (SUTURE) ×3 IMPLANT
SUTURE MNCRL 4-0 27XMF (SUTURE) ×1 IMPLANT
SYR 20CC LL (SYRINGE) ×3 IMPLANT
TROCAR XCEL NON-BLD 11X100MML (ENDOMECHANICALS) ×3 IMPLANT
TROCAR XCEL NON-BLD 5MMX100MML (ENDOMECHANICALS) ×3 IMPLANT
TUBING INSUFFLATOR HI FLOW (MISCELLANEOUS) ×3 IMPLANT

## 2016-03-05 NOTE — ED Triage Notes (Signed)
Pt with epigastric pain started last night with some nausea.

## 2016-03-05 NOTE — Op Note (Signed)
Laparoscopic Cholecystectomy  Pre-operative Diagnosis: Choledocholithiasis  Post-operative Diagnosis: Same  Procedure: Upper scopic cholecystectomy with C-arm fluoroscopic angiography  Surgeon: Adah Salvage. Excell Seltzer, MD FACS  Anesthesia: Gen. with endotracheal tube  Assistant: PA student  Procedure Details  The patient was seen again in the Holding Room. The benefits, complications, treatment options, and expected outcomes were discussed with the patient. The risks of bleeding, infection, recurrence of symptoms, failure to resolve symptoms, bile duct damage, bile duct leak, retained common bile duct stone, bowel injury, any of which could require further surgery and/or ERCP, stent, or papillotomy were reviewed with the patient. The likelihood of improving the patient's symptoms with return to their baseline status is good.  The patient and/or family concurred with the proposed plan, giving informed consent.  The patient was taken to Operating Room, identified as Julia Diaz and the procedure verified as Laparoscopic Cholecystectomy.  A Time Out was held and the above information confirmed.  Prior to the induction of general anesthesia, antibiotic prophylaxis was administered. VTE prophylaxis was in place. General endotracheal anesthesia was then administered and tolerated well. After the induction, the abdomen was prepped with Chloraprep and draped in the sterile fashion. The patient was positioned in the supine position.  Local anesthetic  was injected into the skin near the umbilicus and an incision made. The Veress needle was placed. Pneumoperitoneum was then created with CO2 and tolerated well without any adverse changes in the patient's vital signs. A 5mm port was placed in the periumbilical position and the abdominal cavity was explored.  Two 5-mm ports were placed in the right upper quadrant and a 12 mm epigastric port was placed all under direct vision. All skin incisions  were  infiltrated with a local anesthetic agent before making the incision and placing the trocars.   The patient was positioned  in reverse Trendelenburg, tilted slightly to the patient's left.  The gallbladder was identified, the fundus grasped and retracted cephalad. Adhesions were lysed bluntly. The infundibulum was grasped and retracted laterally, exposing the peritoneum overlying the triangle of Calot. This was then divided and exposed in a blunt fashion. A critical view of the cystic duct and cystic artery was obtained.  The cystic duct was clearly identified and bluntly dissected.   The cystic lymphatics were doubly clipped and divided this allowed for good visualization of the cystic duct as it entered the infundibulum of the gallbladder here it was clipped and incised and through a separate incision and Angiocath a cholangiogram catheter was placed. C-arm fluoroscopic angiography demonstrated that the cystic duct had been cannulated the proximal ducts were well identified the common bile duct appeared dilated and there was no flow into the duodenum and a possible meniscus sign.  1 mg of glucagon was given IV by anesthesia and after 1 minute flushing was performed with saline and the C-arm fluoroscopy was repeated. Again there was no flow into the duodenum but the tapering end of the distal common bile duct was noted suggesting edema of the ampulla.  The cholangiocatheter was removed the cystic duct was doubly clipped and divided the cystic artery was then doubly clipped and divided.  The gallbladder was taken from the gallbladder fossa in a retrograde fashion with the electrocautery. The gallbladder was removed and placed in an Endocatch bag. The liver bed was irrigated and inspected. Hemostasis was achieved with the electrocautery. Copious irrigation was utilized and was repeatedly aspirated until clear.  The gallbladder and Endocatch sac were then removed through the epigastric  port site.    Inspection of the right upper quadrant was performed. No bleeding, bile duct injury or leak, or bowel injury was noted. Pneumoperitoneum was released.  The epigastric port site was closed with figure-of-eight 0 Vicryl sutures. 4-0 subcuticular Monocryl was used to close the skin. Steristrips and Mastisol and sterile dressings were  applied.  The patient was then extubated and brought to the recovery room in stable condition. Sponge, lap, and needle counts were correct at closure and at the conclusion of the case.   Findings: Chronic Cholecystitis , angiography with no flow into the duodenum but changes of the distal common bile duct with glucagon suggestive of ampullary edema as opposed to a common bile duct stone.  Estimated Blood Loss: Minimal         Drains: None         Specimens: Gallbladder           Complications: none               Julia Diaz E. Excell Seltzerooper, MD, FACS

## 2016-03-05 NOTE — Anesthesia Procedure Notes (Signed)
Procedure Name: Intubation Date/Time: 03/05/2016 1:10 PM Performed by: Jaspreet Hollings Pre-anesthesia Checklist: Patient identified, Emergency Drugs available, Suction available, Patient being monitored and Timeout performed Patient Re-evaluated:Patient Re-evaluated prior to inductionOxygen Delivery Method: Circle system utilized Preoxygenation: Pre-oxygenation with 100% oxygen Intubation Type: IV induction Ventilation: Mask ventilation without difficulty Laryngoscope Size: Mac and 3 Tube type: Oral Tube size: 7.0 mm Number of attempts: 1 Airway Equipment and Method: Stylet Placement Confirmation: ETT inserted through vocal cords under direct vision,  positive ETCO2 and breath sounds checked- equal and bilateral Secured at: 21 cm Tube secured with: Tape

## 2016-03-05 NOTE — ED Notes (Signed)
Returned from CT.

## 2016-03-05 NOTE — Anesthesia Preprocedure Evaluation (Signed)
Anesthesia Evaluation  Patient identified by MRN, date of birth, ID band Patient awake    Reviewed: Allergy & Precautions, H&P , NPO status , Patient's Chart, lab work & pertinent test results, reviewed documented beta blocker date and time   Airway Mallampati: II  TM Distance: >3 FB Neck ROM: full    Dental  (+) Teeth Intact   Pulmonary neg pulmonary ROS,    Pulmonary exam normal        Cardiovascular hypertension, negative cardio ROS Normal cardiovascular exam Rhythm:regular Rate:Normal     Neuro/Psych negative neurological ROS  negative psych ROS   GI/Hepatic negative GI ROS, Neg liver ROS,   Endo/Other  negative endocrine ROS  Renal/GU negative Renal ROS  negative genitourinary   Musculoskeletal   Abdominal   Peds  Hematology negative hematology ROS (+)   Anesthesia Other Findings Past Medical History: No date: Arthritis No date: Ear infection No date: Hypertension No date: Thyroid disease Past Surgical History: 02/22/11: BREAST SURGERY     Comment: atypical ductyl hyperplasia of left breast No date: KNEE SURGERY     Comment: right BMI    Body Mass Index:  28.98 kg/m     Reproductive/Obstetrics negative OB ROS                             Anesthesia Physical Anesthesia Plan  ASA: II  Anesthesia Plan: General ETT   Post-op Pain Management:    Induction:   Airway Management Planned:   Additional Equipment:   Intra-op Plan:   Post-operative Plan:   Informed Consent: I have reviewed the patients History and Physical, chart, labs and discussed the procedure including the risks, benefits and alternatives for the proposed anesthesia with the patient or authorized representative who has indicated his/her understanding and acceptance.   Dental Advisory Given  Plan Discussed with: CRNA  Anesthesia Plan Comments:         Anesthesia Quick Evaluation

## 2016-03-05 NOTE — ED Notes (Addendum)
Pt undressing from all except her gown. Aware that pt will be going to OR at approx 12PM today.  Last thing to eat or drink last night at 9PM.

## 2016-03-05 NOTE — ED Notes (Signed)
Pt clothes, purse and earrings with family member and he is taking them from ER room with him. Pt taken to OR by Orderly.

## 2016-03-05 NOTE — Transfer of Care (Signed)
Immediate Anesthesia Transfer of Care Note  Patient: Julia Diaz  Procedure(s) Performed: Procedure(s): LAPAROSCOPIC CHOLECYSTECTOMY WITH INTRAOPERATIVE CHOLANGIOGRAM (N/A)  Patient Location: PACU  Anesthesia Type:General  Level of Consciousness: awake  Airway & Oxygen Therapy: Patient Spontanous Breathing and Patient connected to face mask oxygen  Post-op Assessment: Report given to RN and Post -op Vital signs reviewed and stable  Post vital signs: Reviewed and stable  Last Vitals:  Vitals:   03/05/16 1158 03/05/16 1404  BP: (!) 183/91 136/84  Pulse: 70 99  Resp: 14 11  Temp: (!) 35.8 C 36.2 C    Last Pain:  Vitals:   03/05/16 1404  TempSrc:   PainSc: Asleep         Complications: No apparent anesthesia complications

## 2016-03-05 NOTE — ED Notes (Signed)
Patient transported to Ultrasound 

## 2016-03-05 NOTE — H&P (Signed)
Julia Diaz is an 55 y.o. female.    Chief Complaint: Right upper quadrant pain  HPI: This patient with recurrent episodic right upper quadrant pain she's had several episodes over the last 2 months the most recent was yesterday that went away and then today it came back again and she came to the emergency room. 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 Arthritis   . Ear infection   . Hypertension   . Thyroid disease     Past Surgical History:  Procedure Laterality Date  . BREAST SURGERY  02/22/11   atypical ductyl hyperplasia of left breast  . KNEE SURGERY     right    No family history on file. Social History:  reports that she has never smoked. She has never used smokeless tobacco. She reports that she drinks alcohol. She reports that she does not use drugs.  Allergies:  Allergies  Allergen Reactions  . Aspirin Swelling    Face, eyes. Patient does not remember if breathing was affected.     (Not in a hospital admission)   Review of Systems  Constitutional: Negative for chills and fever.  HENT: Negative.   Eyes: Negative.   Respiratory: Negative.   Cardiovascular: Negative.   Gastrointestinal: Positive for abdominal pain, heartburn and nausea. Negative for blood in stool, constipation, diarrhea, melena and vomiting.  Genitourinary: Negative.   Musculoskeletal: Negative.   Skin: Negative.   Neurological: Negative.   Endo/Heme/Allergies: Negative.   Psychiatric/Behavioral: Negative.      Physical Exam:  BP (!) 165/101   Pulse 77   Temp 98.1 F (36.7 C) (Oral)   Resp 17   Ht 5' 7" (1.702 m)   Wt 185 lb (83.9 kg)   SpO2 100%   BMI 28.98  kg/m   Physical Exam  Constitutional: She is oriented to person, place, and time and well-developed, well-nourished, and in no distress. No distress.  HENT:  Head: Normocephalic and atraumatic.  Eyes: Right eye exhibits no discharge. Left eye exhibits no discharge. No scleral icterus.  Neck: Normal range of motion.  Cardiovascular: Normal rate, regular rhythm and normal heart sounds.   Pulmonary/Chest: Effort normal and breath sounds normal. No respiratory distress. She has no wheezes. She has no rales.  Abdominal: Soft. She exhibits no distension. There is tenderness. There is guarding. There is no rebound.  Tenderness in the right upper quadrant with questionable Murphy sign  Musculoskeletal: Normal range of motion. She exhibits no edema or tenderness.  Lymphadenopathy:    She has no cervical adenopathy.  Neurological: She is alert and oriented to person, place, and time.  Skin: Skin is warm and dry. No rash noted. She is not diaphoretic. No erythema.  Psychiatric: Mood and affect normal.  Vitals reviewed.       Results for orders placed or performed during the hospital encounter of 03/05/16 (from the past 48 hour(s))  CBC with Differential     Status: None   Collection Time: 03/05/16  8:58 AM  Result Value Ref Range   WBC 8.2 3.6 - 11.0 K/uL   RBC 4.39 3.80 - 5.20 MIL/uL   Hemoglobin 13.9 12.0 -  16.0 g/dL   HCT 40.0 35.0 - 47.0 %   MCV 91.1 80.0 - 100.0 fL   MCH 31.6 26.0 - 34.0 pg   MCHC 34.7 32.0 - 36.0 g/dL   RDW 13.0 11.5 - 14.5 %   Platelets 210 150 - 440 K/uL   Neutrophils Relative % 79 %   Neutro Abs 6.5 1.4 - 6.5 K/uL   Lymphocytes Relative 15 %   Lymphs Abs 1.2 1.0 - 3.6 K/uL   Monocytes Relative 5 %   Monocytes Absolute 0.4 0.2 - 0.9 K/uL   Eosinophils Relative 1 %   Eosinophils Absolute 0.1 0 - 0.7 K/uL   Basophils Relative 0 %   Basophils Absolute 0.0 0 - 0.1 K/uL  Comprehensive metabolic panel     Status: Abnormal   Collection Time: 03/05/16  8:58 AM   Result Value Ref Range   Sodium 135 135 - 145 mmol/L   Potassium 3.8 3.5 - 5.1 mmol/L   Chloride 104 101 - 111 mmol/L   CO2 25 22 - 32 mmol/L   Glucose, Bld 114 (H) 65 - 99 mg/dL   BUN 22 (H) 6 - 20 mg/dL   Creatinine, Ser 0.69 0.44 - 1.00 mg/dL   Calcium 8.9 8.9 - 10.3 mg/dL   Total Protein 7.6 6.5 - 8.1 g/dL   Albumin 4.1 3.5 - 5.0 g/dL   AST 184 (H) 15 - 41 U/L   ALT 138 (H) 14 - 54 U/L   Alkaline Phosphatase 89 38 - 126 U/L   Total Bilirubin 0.7 0.3 - 1.2 mg/dL   GFR calc non Af Amer >60 >60 mL/min   GFR calc Af Amer >60 >60 mL/min    Comment: (NOTE) The eGFR has been calculated using the CKD EPI equation. This calculation has not been validated in all clinical situations. eGFR's persistently <60 mL/min signify possible Chronic Kidney Disease.    Anion gap 6 5 - 15  Lipase, blood     Status: None   Collection Time: 03/05/16  8:58 AM  Result Value Ref Range   Lipase 21 11 - 51 U/L  Troponin I     Status: None   Collection Time: 03/05/16  8:58 AM  Result Value Ref Range   Troponin I <0.03 <0.03 ng/mL  Urinalysis complete, with microscopic ([facility] only)     Status: Abnormal   Collection Time: 03/05/16  9:30 AM  Result Value Ref Range   Color, Urine YELLOW (A) YELLOW   APPearance CLEAR (A) CLEAR   Glucose, UA NEGATIVE NEGATIVE mg/dL   Bilirubin Urine NEGATIVE NEGATIVE   Ketones, ur NEGATIVE NEGATIVE mg/dL   Specific Gravity, Urine 1.023 1.005 - 1.030   Hgb urine dipstick 2+ (A) NEGATIVE   pH 6.0 5.0 - 8.0   Protein, ur NEGATIVE NEGATIVE mg/dL   Nitrite NEGATIVE NEGATIVE   Leukocytes, UA 1+ (A) NEGATIVE   RBC / HPF 6-30 0 - 5 RBC/hpf   WBC, UA 0-5 0 - 5 WBC/hpf   Bacteria, UA NONE SEEN NONE SEEN   Squamous Epithelial / LPF 0-5 (A) NONE SEEN   Mucous PRESENT    US Abdomen Limited Ruq  Result Date: 03/05/2016 CLINICAL DATA:  One day of epigastric abdominal pain EXAM: US ABDOMEN LIMITED - RIGHT UPPER QUADRANT COMPARISON:  None. FINDINGS: Gallbladder: The gallbladder  is adequately distended. There are multiple gallstones demonstrated. Movement of the stones was not clearly evident today appear to be floating in the bile pool. There may be a  small amount of pericholecystic fluid. There is no gallbladder wall thickening or positive sonographic Murphy's sign. Common bile duct: Diameter: 4 mm Liver: There is increased hepatic echotexture diffusely. There is no focal mass nor ductal dilation. IMPRESSION: Gallstones. Possible pericholecystic fluid. No positive sonographic Murphy's sign. Fatty infiltration of the liver.  Normal appearing common bile duct. Electronically Signed   By: David  Martinique M.D.   On: 03/05/2016 10:13     Assessment/Plan  This patient with a history of recurrent and episodic right upper quadrant pain but over the last 24 hours she's had 2 episodes these being different was some back pain. She has also had signs of elevated liver function tests suggesting choledocholithiasis. I discussed with she and her husband the options of observation and elective procedure but my concern is that she could have stones present in her bile duct and that she would be at risk for cholangitis. I offered surgical intervention at this hospitalization which they have chosen. I discussed the procedure itself the options of observation risk bleeding infection recurrence of symptoms failure to resolve her symptoms conversion to an open procedure bile duct damage bile duct leak retained common bile duct stone any of which could require further surgery and/or ERCP stent or papillotomy this was reviewed with them they understood and agreed to proceed multiple questions were answered.  Florene Glen, MD, FACS

## 2016-03-05 NOTE — ED Provider Notes (Signed)
[facility] Emergency Department Provider Note   ____________________________________________   First MD Initiated Contact with Patient 03/05/16 [phone]     (approximate)  I have reviewed the triage vital signs and the nursing notes.   HISTORY  Chief Complaint Abdominal Pain (epigastric pain)   HPI Julia Diaz is a 55 y.o. female with a history of hypertension as well as GERD who is presenting to the emergency department today with epigastric abdominal pain. She says that she has had this pain before and it has been coming and going since last night. She says the pain is mild now but increases when she lies down. She has taken 2 doses of Maalox without any relief. She says the pain feels like a pressure and is radiating through to her back. She says that she still has her gallbladder. Denies any nausea vomiting or diarrhea.   Past Medical History:  Diagnosis Date  . Arthritis   . Ear infection   . Hypertension   . Thyroid disease     There are no active problems to display for this patient.   Past Surgical History:  Procedure Laterality Date  . BREAST SURGERY  02/22/11   atypical ductyl hyperplasia of left breast  . KNEE SURGERY     right    Prior to Admission medications   Medication Sig Start Date End Date Taking? Authorizing Provider  ibuprofen (ADVIL,MOTRIN) 200 MG tablet Take 200 mg by mouth every 6 (six) hours as needed.   Yes Historical Provider, MD  levothyroxine (SYNTHROID, LEVOTHROID) 75 MCG tablet Take 75 mcg by mouth daily.     Yes Historical Provider, MD  valsartan-hydrochlorothiazide (DIOVAN-HCT) 80-12.5 MG tablet Take 1 tablet by mouth daily. 02/08/16  Yes Historical Provider, MD  meloxicam (MOBIC) 7.5 MG tablet  01/28/11   Historical Provider, MD    Allergies Aspirin  No family history on file.  Social History Social History  Substance Use Topics  . Smoking status: Never Smoker  . Smokeless tobacco: Never Used  .  Alcohol use Yes     Comment: socially    Review of Systems Constitutional: No fever/chills Eyes: No visual changes. ENT: No sore throat. Cardiovascular: Denies chest pain. Respiratory: Denies shortness of breath. Gastrointestinal:  No nausea, no vomiting.  No diarrhea.  No constipation. Genitourinary: Negative for dysuria. Musculoskeletal: As above Skin: Negative for rash. Neurological: Negative for headaches, focal weakness or numbness.  10-point ROS otherwise negative.  ____________________________________________   PHYSICAL EXAM:  VITAL SIGNS: ED Triage Vitals [03/05/16 0852]  Enc Vitals Group     BP (!) 164/94     Pulse Rate 72     Resp 18     Temp 98.1 F (36.7 C)     Temp Source Oral     SpO2 100 %     Weight 185 lb (83.9 kg)     Height 5\' 7"  (1.702 m)     Head Circumference      Peak Flow      Pain Score 10     Pain Loc      Pain Edu?      Excl. in GC?     Constitutional: Alert and oriented. Well appearing and in no acute distress. Eyes: Conjunctivae are normal. PERRL. EOMI. Head: Atraumatic. Nose: No congestion/rhinnorhea. Mouth/Throat: Mucous membranes are moist.   Neck: No stridor.   Cardiovascular: Normal rate, regular rhythm. Grossly normal heart sounds.  Good peripheral circulation. Respiratory: Normal respiratory effort.  No retractions.  Lungs CTAB. Gastrointestinal: Soft With mild epigastric as well as right upper quadrant tenderness without positive Murphy sign. No distention.  No CVA tenderness. Musculoskeletal: No lower extremity tenderness nor edema.  No joint effusions. Neurologic:  Normal speech and language. No gross focal neurologic deficits are appreciated.  Skin:  Skin is warm, dry and intact. No rash noted. Psychiatric: Mood and affect are normal. Speech and behavior are normal.  ____________________________________________   LABS (all labs ordered are listed, but only abnormal results are displayed)  Labs Reviewed    COMPREHENSIVE METABOLIC PANEL - Abnormal; Notable for the following:       Result Value   Glucose, Bld 114 (*)    BUN 22 (*)    AST 184 (*)    ALT 138 (*)    All other components within normal limits  URINALYSIS COMPLETEWITH MICROSCOPIC ([facility] ONLY) - Abnormal; Notable for the following:    Color, Urine YELLOW (*)    APPearance CLEAR (*)    Hgb urine dipstick 2+ (*)    Leukocytes, UA 1+ (*)    Squamous Epithelial / LPF 0-5 (*)    All other components within normal limits  CBC WITH DIFFERENTIAL/PLATELET  LIPASE, BLOOD  TROPONIN I   ____________________________________________  EKG  ED ECG REPORT I, Arelia Longest, the attending physician, personally viewed and interpreted this ECG.   Date: 03/05/2016  EKG Time: 903  Rate: 85  Rhythm: normal sinus rhythm  Axis: Normal axis  Intervals:none  ST&T Change: No ST segment elevation or depression. No abnormal T-wave inversion.  ____________________________________________  RADIOLOGY  US Abdomen Limited RUQ (Accession [id number]) (Order [id number])  Imaging  Date: 03/05/2016 Department: [facility] EMERGENCY DEPARTMENT Released By/Authorizing: Myrna Blazer, MD (auto-released)  PACS Images   Show images for US Abdomen Limited RUQ  Study Result   CLINICAL DATA:  One day of epigastric abdominal pain  EXAM: US ABDOMEN LIMITED - RIGHT UPPER QUADRANT  COMPARISON:  None.  FINDINGS: Gallbladder:  The gallbladder is adequately distended. There are multiple gallstones demonstrated. Movement of the stones was not clearly evident today appear to be floating in the bile pool. There may be a small amount of pericholecystic fluid. There is no gallbladder wall thickening or positive sonographic Murphy's sign.  Common bile duct:  Diameter: 4 mm  Liver:  There is increased hepatic echotexture diffusely. There is no focal mass nor ductal dilation.  IMPRESSION: Gallstones. Possible  pericholecystic fluid. No positive sonographic Murphy's sign.  Fatty infiltration of the liver.  Normal appearing common bile duct.   Electronically Signed   By: David  [place] M.D.   On: 03/05/2016 10:13     ____________________________________________   PROCEDURES  Procedure(s) performed:   Procedures  Critical Care performed:   ____________________________________________   INITIAL IMPRESSION / ASSESSMENT AND PLAN / ED COURSE  Pertinent labs & imaging results that were available during my care of the patient were reviewed by me and considered in my medical decision making (see chart for details).  ----------------------------------------- 10:40 AM on 03/05/2016 -----------------------------------------  Patient says that her pain is resolved after morphine. Concerning ultrasound in addition to a slight bump in her transaminases. Discussed case with Dr. Excell Seltzer who will be down to evaluate the patient.  Clinical Course   ----------------------------------------- 11:12 AM on 03/05/2016 -----------------------------------------  Patient evaluate by Dr. Excell Seltzer who will be admitting the patient. Likely cholecystectomy this afternoon. Patient is aware of the plan and agrees.  ____________________________________________   FINAL CLINICAL  IMPRESSION(S) / ED DIAGNOSES  Final diagnoses:  Epigastric abdominal pain   Acute cholecystitis.   NEW MEDICATIONS STARTED DURING THIS VISIT:  New Prescriptions   No medications on file     Note:  This document was prepared using Dragon voice recognition software and may include unintentional dictation errors.    Myrna Blazer, MD 03/05/16 [phone]

## 2016-03-05 NOTE — Anesthesia Postprocedure Evaluation (Signed)
Anesthesia Post Note  Patient: Julia Diaz  Procedure(s) Performed: Procedure(s) (LRB): LAPAROSCOPIC CHOLECYSTECTOMY WITH INTRAOPERATIVE CHOLANGIOGRAM (N/A)  Patient location during evaluation: PACU Anesthesia Type: General Level of consciousness: awake and alert Pain management: pain level controlled Vital Signs Assessment: post-procedure vital signs reviewed and stable Respiratory status: spontaneous breathing, nonlabored ventilation, respiratory function stable and patient connected to nasal cannula oxygen Cardiovascular status: blood pressure returned to baseline and stable Postop Assessment: no signs of nausea or vomiting Anesthetic complications: no    Last Vitals:  Vitals:   03/05/16 1449 03/05/16 1459  BP: 130/64 122/71  Pulse: 90 92  Resp: 18 15  Temp:  36.2 C    Last Pain:  Vitals:   03/05/16 1459  TempSrc:   PainSc: 3                  Cleda MccreedyJoseph K Kendallyn Lippold

## 2016-03-06 ENCOUNTER — Encounter: Payer: Self-pay | Admitting: Surgery

## 2016-03-06 LAB — CBC
HEMATOCRIT: 37.2 % (ref 35.0–47.0)
Hemoglobin: 12.8 g/dL (ref 12.0–16.0)
MCH: 31.4 pg (ref 26.0–34.0)
MCHC: 34.4 g/dL (ref 32.0–36.0)
MCV: 91.2 fL (ref 80.0–100.0)
Platelets: 197 10*3/uL (ref 150–440)
RBC: 4.08 MIL/uL (ref 3.80–5.20)
RDW: 13 % (ref 11.5–14.5)
WBC: 8.5 10*3/uL (ref 3.6–11.0)

## 2016-03-06 LAB — COMPREHENSIVE METABOLIC PANEL
ALBUMIN: 3.5 g/dL (ref 3.5–5.0)
ALK PHOS: 83 U/L (ref 38–126)
ALT: 276 U/L — AB (ref 14–54)
AST: 243 U/L — AB (ref 15–41)
Anion gap: 4 — ABNORMAL LOW (ref 5–15)
BILIRUBIN TOTAL: 0.5 mg/dL (ref 0.3–1.2)
BUN: 12 mg/dL (ref 6–20)
CALCIUM: 8.4 mg/dL — AB (ref 8.9–10.3)
CO2: 28 mmol/L (ref 22–32)
CREATININE: 0.66 mg/dL (ref 0.44–1.00)
Chloride: 106 mmol/L (ref 101–111)
GFR calc Af Amer: >60 mL/min (ref >60)
GFR calc non Af Amer: >60 mL/min (ref >60)
GLUCOSE: 111 mg/dL — AB (ref 65–99)
Potassium: 3.2 mmol/L — ABNORMAL LOW (ref 3.5–5.1)
Sodium: 138 mmol/L (ref 135–145)
TOTAL PROTEIN: 6.6 g/dL (ref 6.5–8.1)

## 2016-03-06 LAB — SURGICAL PATHOLOGY

## 2016-03-06 NOTE — Progress Notes (Signed)
Visited patient. She is feeling better this afternoon than she did this morning but is not ready to go home. She still having some pain wants to try advancing diet however.  Abdomen soft nontender wounds dressed.  Will advance diet and recheck LFTs in the morning and likely discharge

## 2016-03-06 NOTE — Progress Notes (Signed)
1 Day Post-Op  Subjective: This patient 1 day status post lap or scopic cholecystectomy with cholangiography. There was no flow into the duodenum but no stone was identifiable either on cholangiography.  Today she states she has no pain but is "sore" he has no nausea or vomiting but does not want to take anything except coffee by mouth at this time. She appears somewhat uncomfortable  Objective: Vital signs in last 24 hours: Temp:  [96.5 F (35.8 C)-98.7 F (37.1 C)] 98.1 F (36.7 C) (09/13 0502) Pulse Rate:  [68-99] 68 (09/13 0911) Resp:  [11-19] 19 (09/13 0502) BP: (122-183)/(62-91) 148/68 (09/13 0911) SpO2:  [95 %-100 %] 97 % (09/13 0911) Weight:  [189 lb (85.7 kg)] 189 lb (85.7 kg) (09/12 1526) Last BM Date:  (pt unsure)  Intake/Output from previous day: 09/12 0701 - 09/13 0700 In: 1464.7 [I.V.:1364.7; IV Piggyback:100] Out: 305 [Urine:300; Blood:5] Intake/Output this shift: Total I/O In: 58 [I.V.:58] Out: 300 [Urine:300]  Physical exam:  I'll signs are reviewed. She appears uncomfortable no icterus no jaundice abdomen is soft nontender dressings are intact  Lab Results: CBC   Recent Labs  03/05/16 0858 03/06/16 0423  WBC 8.2 8.5  HGB 13.9 12.8  HCT 40.0 37.2  PLT 210 197   BMET  Recent Labs  03/05/16 0858 03/06/16 0423  NA 135 138  K 3.8 3.2*  CL 104 106  CO2 25 28  GLUCOSE 114* 111*  BUN 22* 12  CREATININE 0.69 0.66  CALCIUM 8.9 8.4*   PT/INR No results for input(s): LABPROT, INR in the last 72 hours. ABG No results for input(s): PHART, HCO3 in the last 72 hours.  Invalid input(s): PCO2, PO2  Studies/Results: Dg Cholangiogram Operative  Result Date: 03/05/2016 CLINICAL DATA:  Intraoperative cholangiogram during laparoscopic cholecystectomy. EXAM: INTRAOPERATIVE CHOLANGIOGRAM FLUOROSCOPY TIME:  39 seconds COMPARISON:  Right upper quadrant abdominal ultrasound - 03/05/2016 FINDINGS: Intraoperative cholangiographic images of the right upper  abdominal quadrant during laparoscopic cholecystectomy are provided for review. Surgical clips overlie the expected location of the gallbladder fossa. Contrast injection demonstrates selective cannulation of the central aspect of the cystic duct. There is passage of contrast through the central aspect of the cystic duct with filling of a non dilated common bile duct, however there is an apparent lenticular form occlusive filling defect within the distal aspect of the CBD worrisome for choledocholithiasis. There is minimal reflux of injected contrast into the common hepatic duct and central aspect of the non dilated intrahepatic biliary system. IMPRESSION: Occlusive lenticular form filling defect within the distal aspect of the CBD worrisome for choledocholithiasis. Further evaluation and intervention could be performed with ERCP as indicated. Electronically Signed   By: Simonne ComeJohn  Watts M.D.   On: 03/05/2016 16:02   [place] Abdomen Limited Ruq  Result Date: 03/05/2016 CLINICAL DATA:  One day of epigastric abdominal pain EXAM: US ABDOMEN LIMITED - RIGHT UPPER QUADRANT COMPARISON:  None. FINDINGS: Gallbladder: The gallbladder is adequately distended. There are multiple gallstones demonstrated. Movement of the stones was not clearly evident today appear to be floating in the bile pool. There may be a small amount of pericholecystic fluid. There is no gallbladder wall thickening or positive sonographic Murphy's sign. Common bile duct: Diameter: 4 mm Liver: There is increased hepatic echotexture diffusely. There is no focal mass nor ductal dilation. IMPRESSION: Gallstones. Possible pericholecystic fluid. No positive sonographic Murphy's sign. Fatty infiltration of the liver.  Normal appearing common bile duct. Electronically Signed   By: David  [place]azil[place] M.D.  On: 03/05/2016 10:13    Anti-infectives: Anti-infectives    Start     Dose/Rate Route Frequency Ordered Stop   03/05/16 1900  ceFAZolin (ANCEF) IVPB 1 g/50 mL premix      1 g 100 mL/hr over 30 Minutes Intravenous Every 6 hours 03/05/16 1353     03/05/16 1201  ceFAZolin (ANCEF) 2-4 GM/100ML-% IVPB    Comments:  Slemenda, Debbie: cabinet override      03/05/16 1201 03/06/16 0014   03/05/16 1200  ceFAZolin (ANCEF) IVPB 2g/100 mL premix     2 g 200 mL/hr over 30 Minutes Intravenous  Once 03/05/16 1157 03/05/16 1310      Assessment/Plan: s/p Procedure(s): LAPAROSCOPIC CHOLECYSTECTOMY WITH INTRAOPERATIVE CHOLANGIOGRAM   Laboratory values are reviewed LFTs are slightly elevated, AST ALT, but bilirubin is normal. We'll continue to observe at this point keep her on clear liquids today and I will reexamine her this afternoon possibly advancing diet I would not recommend ERCP at this particular moment.  Lattie Haw, MD, FACS  03/06/2016

## 2016-03-07 LAB — COMPREHENSIVE METABOLIC PANEL
ALBUMIN: 3.3 g/dL — AB (ref 3.5–5.0)
ALK PHOS: 82 U/L (ref 38–126)
ALT: 188 U/L — ABNORMAL HIGH (ref 14–54)
ANION GAP: 4 — AB (ref 5–15)
AST: 107 U/L — ABNORMAL HIGH (ref 15–41)
BUN: 8 mg/dL (ref 6–20)
CHLORIDE: 101 mmol/L (ref 101–111)
CO2: 31 mmol/L (ref 22–32)
Calcium: 8.4 mg/dL — ABNORMAL LOW (ref 8.9–10.3)
Creatinine, Ser: 0.64 mg/dL (ref 0.44–1.00)
GFR calc non Af Amer: >60 mL/min (ref >60)
GLUCOSE: 127 mg/dL — AB (ref 65–99)
POTASSIUM: 3.2 mmol/L — AB (ref 3.5–5.1)
SODIUM: 136 mmol/L (ref 135–145)
Total Bilirubin: 0.6 mg/dL (ref 0.3–1.2)
Total Protein: 6.6 g/dL (ref 6.5–8.1)

## 2016-03-07 LAB — CBC WITH DIFFERENTIAL/PLATELET
BASOS PCT: 1 %
Basophils Absolute: 0 10*3/uL (ref 0–0.1)
EOS ABS: 0.1 10*3/uL (ref 0–0.7)
EOS PCT: 2 %
HCT: 37.4 % (ref 35.0–47.0)
HEMOGLOBIN: 12.8 g/dL (ref 12.0–16.0)
Lymphocytes Relative: 23 %
Lymphs Abs: 1.5 10*3/uL (ref 1.0–3.6)
MCH: 31.4 pg (ref 26.0–34.0)
MCHC: 34.3 g/dL (ref 32.0–36.0)
MCV: 91.5 fL (ref 80.0–100.0)
MONOS PCT: 7 %
Monocytes Absolute: 0.4 10*3/uL (ref 0.2–0.9)
NEUTROS PCT: 67 %
Neutro Abs: 4.3 10*3/uL (ref 1.4–6.5)
PLATELETS: 182 10*3/uL (ref 150–440)
RBC: 4.09 MIL/uL (ref 3.80–5.20)
RDW: 12.9 % (ref 11.5–14.5)
WBC: 6.3 10*3/uL (ref 3.6–11.0)

## 2016-03-07 MED ORDER — HYDROCODONE-ACETAMINOPHEN 5-325 MG PO TABS: 1.0000 | ORAL_TABLET | ORAL | 0 refills | Status: DC | PRN

## 2016-03-07 NOTE — Progress Notes (Signed)
Pt to be discharged per MD order. IV removed. Instructions reviewed with pt and family. All questions answered. Scripts given to pt. Will be taken out in wheelchair.  

## 2016-03-07 NOTE — Discharge Summary (Signed)
Physician Discharge Summary  Patient ID: Julia Diaz MRN: [id number] DOB/AGE: 1961/01/21 55 y.o.  Admit date: 03/05/2016 Discharge date: 03/07/2016   Discharge Diagnoses:  Active Problems:   Choledocholithiasis   Procedures:Laparoscopic cholecystectomy with cholangiogram  Hospital Course: This a patient with signs of choledocholithiasis on admission from the emergency room. She was taken the operating room where C-arm fluoroscopic angiography and cholecystectomy was performed laparoscopically. No stone was clearly identified but there was poor flow into the duodenum. Her liver function tests immediately after surgery were elevated but they came down to half with a had been and she is tolerating a regular diet she's discharged in stable condition with instructions to shower and follow up in my office in 10 days. She is to return sooner should she experience increasing pain or nausea vomiting fevers chills or jaundice.  Consults: None  Disposition: 01-Home or Self Care     Medication List    TAKE these medications   HYDROcodone-acetaminophen 5-325 MG tablet Commonly known as:  NORCO/VICODIN Take 1-2 tablets by mouth every 4 (four) hours as needed for moderate pain.   ibuprofen 200 MG tablet Commonly known as:  ADVIL,MOTRIN Take 200 mg by mouth every 6 (six) hours as needed.   levothyroxine 75 MCG tablet Commonly known as:  SYNTHROID, LEVOTHROID Take 75 mcg by mouth daily.   meloxicam 7.5 MG tablet Commonly known as:  MOBIC   valsartan-hydrochlorothiazide 80-12.5 MG tablet Commonly known as:  DIOVAN-HCT Take 1 tablet by mouth daily.      Follow-up Information    Dionne Miloichard Chibuikem Thang, MD Follow up in 2 week(s).   Specialty:  Surgery Contact information: [address] [place] [id number] [phone]           Lattie Hawichard E Tawnia Schirm, MD, FACS

## 2016-03-07 NOTE — Discharge Instructions (Signed)
Remove dressing in 24 hours. °May shower in 24 hours. °Leave paper strips in place. °Resume all home medications. °Follow-up with Dr. Emrys Mckamie in 10 days. °

## 2016-03-07 NOTE — Progress Notes (Signed)
2 Days Post-Op  Subjective: Status post laparoscopic cholecystectomy with cholangiograms. Patient is feeling better today and may be able to discharge later this afternoon she is tolerating a diet.  Objective: Vital signs in last 24 hours: Temp:  [97.8 F (36.6 C)-98.4 F (36.9 C)] 98.4 F (36.9 C) (09/14 0448) Pulse Rate:  [59-74] 74 (09/14 0448) Resp:  [15-19] 15 (09/14 0448) BP: (140-151)/(67-84) 151/84 (09/14 0448) SpO2:  [96 %-99 %] 96 % (09/14 0448) Last BM Date: 03/05/16  Intake/Output from previous day: 09/13 0701 - 09/14 0700 In: 2540 [P.O.:2120; I.V.:420] Out: 800 [Urine:800] Intake/Output this shift: No intake/output data recorded.  Physical exam:  No icterus no jaundice abdomen is soft nontender wounds are dressed.  Lab Results: CBC   Recent Labs  03/06/16 0423 03/07/16 0439  WBC 8.5 6.3  HGB 12.8 12.8  HCT 37.2 37.4  PLT 197 182   BMET  Recent Labs  03/06/16 0423 03/07/16 0439  NA 138 136  K 3.2* 3.2*  CL 106 101  CO2 28 31  GLUCOSE 111* 127*  BUN 12 8  CREATININE 0.66 0.64  CALCIUM 8.4* 8.4*   PT/INR No results for input(s): LABPROT, INR in the last 72 hours. ABG No results for input(s): PHART, HCO3 in the last 72 hours.  Invalid input(s): PCO2, PO2  Studies/Results: Dg Cholangiogram Operative  Result Date: 03/05/2016 CLINICAL DATA:  Intraoperative cholangiogram during laparoscopic cholecystectomy. EXAM: INTRAOPERATIVE CHOLANGIOGRAM FLUOROSCOPY TIME:  39 seconds COMPARISON:  Right upper quadrant abdominal ultrasound - 03/05/2016 FINDINGS: Intraoperative cholangiographic images of the right upper abdominal quadrant during laparoscopic cholecystectomy are provided for review. Surgical clips overlie the expected location of the gallbladder fossa. Contrast injection demonstrates selective cannulation of the central aspect of the cystic duct. There is passage of contrast through the central aspect of the cystic duct with filling of a non  dilated common bile duct, however there is an apparent lenticular form occlusive filling defect within the distal aspect of the CBD worrisome for choledocholithiasis. There is minimal reflux of injected contrast into the common hepatic duct and central aspect of the non dilated intrahepatic biliary system. IMPRESSION: Occlusive lenticular form filling defect within the distal aspect of the CBD worrisome for choledocholithiasis. Further evaluation and intervention could be performed with ERCP as indicated. Electronically Signed   By: Simonne ComeJohn  Watts M.D.   On: 03/05/2016 16:02   [place] Abdomen Limited Ruq  Result Date: 03/05/2016 CLINICAL DATA:  One day of epigastric abdominal pain EXAM: US ABDOMEN LIMITED - RIGHT UPPER QUADRANT COMPARISON:  None. FINDINGS: Gallbladder: The gallbladder is adequately distended. There are multiple gallstones demonstrated. Movement of the stones was not clearly evident today appear to be floating in the bile pool. There may be a small amount of pericholecystic fluid. There is no gallbladder wall thickening or positive sonographic Murphy's sign. Common bile duct: Diameter: 4 mm Liver: There is increased hepatic echotexture diffusely. There is no focal mass nor ductal dilation. IMPRESSION: Gallstones. Possible pericholecystic fluid. No positive sonographic Murphy's sign. Fatty infiltration of the liver.  Normal appearing common bile duct. Electronically Signed   By: David  [place]azilandJordan M.D.   On: 03/05/2016 10:13    Anti-infectives: Anti-infectives    Start     Dose/Rate Route Frequency Ordered Stop   03/05/16 1900  ceFAZolin (ANCEF) IVPB 1 g/50 mL premix     1 g 100 mL/hr over 30 Minutes Intravenous Every 6 hours 03/05/16 1353     03/05/16 1201  ceFAZolin (ANCEF) 2-4 GM/100ML-% IVPB  CommentsEvelina Bucy: cabinet override      03/05/16 1201 03/06/16 0014   03/05/16 1200  ceFAZolin (ANCEF) IVPB 2g/100 mL premix     2 g 200 mL/hr over 30 Minutes Intravenous  Once 03/05/16 1157  03/05/16 1310      Assessment/Plan: s/p Procedure(s): LAPAROSCOPIC CHOLECYSTECTOMY WITH INTRAOPERATIVE CHOLANGIOGRAM   LFTs are improved and patient feels better will be back later today after she has breakfast to see if she can be discharged.  Lattie Haw, MD, FACS  03/07/2016

## 2016-03-12 ENCOUNTER — Telehealth: Payer: Self-pay

## 2016-03-12 NOTE — Telephone Encounter (Signed)
Disability and FMLA Forms were filled out and faxed to [employer].

## 2016-03-19 ENCOUNTER — Ambulatory Visit (INDEPENDENT_AMBULATORY_CARE_PROVIDER_SITE_OTHER): Payer: 59 | Admitting: General Surgery

## 2016-03-19 ENCOUNTER — Encounter: Payer: Self-pay | Admitting: General Surgery

## 2016-03-19 VITALS — BP 146/81 | HR 72 | Temp 97.6°F | Ht 63.0 in | Wt 187.0 lb

## 2016-03-19 DIAGNOSIS — Z4889 Encounter for other specified surgical aftercare: Secondary | ICD-10-CM

## 2016-03-19 NOTE — Progress Notes (Signed)
Outpatient Surgical Follow Up  03/19/2016  Julia Diaz is an 55 y.o. female.   Chief Complaint  Patient presents with  . Routine Post Op    Laparscopic Cholecystitis w/intraoperative Cholangiogram-03/05/16-Dr.Cooper    HPI: 55 year old female returns to clinic 2 weeks status post laparoscopic cholecystectomy. Patient reports feeling much better than before surgery. She's been dealing with constipation and bloating but denies any fevers, chills, nausea, vomiting, pain. She's been very happy with her surgical experience.  Past Medical History:  Diagnosis Date  . Arthritis   . Ear infection   . Hypertension   . Thyroid disease     Past Surgical History:  Procedure Laterality Date  . BREAST SURGERY  02/22/11   atypical ductyl hyperplasia of left breast  . CHOLECYSTECTOMY N/A 03/05/2016   Procedure: LAPAROSCOPIC CHOLECYSTECTOMY WITH INTRAOPERATIVE CHOLANGIOGRAM;  Surgeon: Lattie Hawichard E Cooper, MD;  Location: [facility] ORS;  Service: General;  Laterality: N/A;  . KNEE SURGERY     right    History reviewed. No pertinent family history.  Social History:  reports that she has never smoked. She has never used smokeless tobacco. She reports that she drinks alcohol. She reports that she does not use drugs.  Allergies:  Allergies  Allergen Reactions  . Aspirin Swelling    Face, eyes. Patient does not remember if breathing was affected. Face, eyes. Patient does not remember if breathing was affected.    Medications reviewed.    ROS A multipoint review of systems was completed, all pertinent positives and negatives are documented within the history of present illness and the remainder are negative.   BP (!) 146/81   Pulse 72   Temp 97.6 F (36.4 C) (Oral)   Ht 5\' 3"  (1.6 m)   Wt 84.8 kg (187 lb)   BMI 33.13 kg/m   Physical Exam Gen.: No acute distress Chest: Clear to auscultation Heart: Regular rate and rhythm Apical: Soft, nondistended, mildly tender to palpation at the  upper midline incision site. No evidence of erythema or drainage from any incisions. They are all well approximated and healing well.    No results found for this or any previous visit (from the past 48 hour(s)). No results found.  Assessment/Plan:  1. Aftercare following surgery 55 year old female status post laparoscopic cholecystectomy. Doing very well. Reviewed with her her postoperative precautions and anticipated healing time frame. All questions answered to the patient and patient's husband satisfaction. And no was provided for return to work and she will follow-up in clinic on an as-needed basis.     Ricarda Frameharles Melony Tenpas, MD FACS General Surgeon  03/19/2016,10:43 AM

## 2016-03-19 NOTE — Patient Instructions (Addendum)
Please call our office if you have questions or concerns.    Constipation, Adult Constipation is when a person has fewer than three bowel movements a week, has difficulty having a bowel movement, or has stools that are dry, hard, or larger than normal. As people grow older, constipation is more common. A low-fiber diet, not taking in enough fluids, and taking certain medicines may make constipation worse.  CAUSES   Certain medicines, such as antidepressants, pain medicine, iron supplements, antacids, and water pills.   Certain diseases, such as diabetes, irritable bowel syndrome (IBS), thyroid disease, or depression.   Not drinking enough water.   Not eating enough fiber-rich foods.   Stress or travel.   Lack of physical activity or exercise.   Ignoring the urge to have a bowel movement.   Using laxatives too much.  SIGNS AND SYMPTOMS   Having fewer than three bowel movements a week.   Straining to have a bowel movement.   Having stools that are hard, dry, or larger than normal.   Feeling full or bloated.   Pain in the lower abdomen.   Not feeling relief after having a bowel movement.  DIAGNOSIS  Your health care provider will take a medical history and perform a physical exam. Further testing may be done for severe constipation. Some tests may include:  A barium enema X-ray to examine your rectum, colon, and, sometimes, your small intestine.   A sigmoidoscopy to examine your lower colon.   A colonoscopy to examine your entire colon. TREATMENT  Treatment will depend on the severity of your constipation and what is causing it. Some dietary treatments include drinking more fluids and eating more fiber-rich foods. Lifestyle treatments may include regular exercise. If these diet and lifestyle recommendations do not help, your health care provider may recommend taking over-the-counter laxative medicines to help you have bowel movements. Prescription medicines  may be prescribed if over-the-counter medicines do not work.  HOME CARE INSTRUCTIONS   Eat foods that have a lot of fiber, such as fruits, vegetables, whole grains, and beans.  Limit foods high in fat and processed sugars, such as french fries, hamburgers, cookies, candies, and soda.   A fiber supplement may be added to your diet if you cannot get enough fiber from foods.   Drink enough fluids to keep your urine clear or pale yellow.   Exercise regularly or as directed by your health care provider.   Go to the restroom when you have the urge to go. Do not hold it.   Only take over-the-counter or prescription medicines as directed by your health care provider. Do not take other medicines for constipation without talking to your health care provider first.  SEEK IMMEDIATE MEDICAL CARE IF:   You have bright red blood in your stool.   Your constipation lasts for more than 4 days or gets worse.   You have abdominal or rectal pain.   You have thin, pencil-like stools.   You have unexplained weight loss. MAKE SURE YOU:   Understand these instructions.  Will watch your condition.  Will get help right away if you are not doing well or get worse.   This information is not intended to replace advice given to you by your health care provider. Make sure you discuss any questions you have with your health care provider.   Document Released: 03/08/2004 Document Revised: 07/01/2014 Document Reviewed: 03/22/2013 Elsevier Interactive Patient Education [employer]2016 Elsevier Inc.

## 2016-10-24 ENCOUNTER — Other Ambulatory Visit: Payer: Self-pay | Admitting: Obstetrics & Gynecology

## 2016-10-28 LAB — CYTOLOGY - PAP

## 2017-01-13 ENCOUNTER — Other Ambulatory Visit: Payer: Self-pay | Admitting: Internal Medicine

## 2017-01-13 DIAGNOSIS — Z1231 Encounter for screening mammogram for malignant neoplasm of breast: Secondary | ICD-10-CM

## 2017-03-26 ENCOUNTER — Ambulatory Visit
Admission: RE | Admit: 2017-03-26 | Discharge: 2017-03-26 | Disposition: A | Discharge status: Home / Self Care | Payer: 59 | Source: Ambulatory Visit | Attending: Internal Medicine | Admitting: Internal Medicine

## 2017-03-26 DIAGNOSIS — Z1231 Encounter for screening mammogram for malignant neoplasm of breast: Secondary | ICD-10-CM

## 2017-08-08 ENCOUNTER — Encounter: Payer: Self-pay | Admitting: Emergency Medicine

## 2017-08-08 ENCOUNTER — Emergency Department: Payer: 59

## 2017-08-08 ENCOUNTER — Other Ambulatory Visit: Payer: Self-pay

## 2017-08-08 DIAGNOSIS — R42 Dizziness and giddiness: Secondary | ICD-10-CM | POA: Diagnosis not present

## 2017-08-08 DIAGNOSIS — E079 Disorder of thyroid, unspecified: Secondary | ICD-10-CM | POA: Diagnosis not present

## 2017-08-08 DIAGNOSIS — R61 Generalized hyperhidrosis: Secondary | ICD-10-CM | POA: Insufficient documentation

## 2017-08-08 DIAGNOSIS — R079 Chest pain, unspecified: Secondary | ICD-10-CM | POA: Insufficient documentation

## 2017-08-08 DIAGNOSIS — R0602 Shortness of breath: Secondary | ICD-10-CM | POA: Diagnosis not present

## 2017-08-08 DIAGNOSIS — Z5320 Procedure and treatment not carried out because of patient's decision for unspecified reasons: Secondary | ICD-10-CM | POA: Diagnosis not present

## 2017-08-08 DIAGNOSIS — I10 Essential (primary) hypertension: Secondary | ICD-10-CM | POA: Insufficient documentation

## 2017-08-08 DIAGNOSIS — Z79899 Other long term (current) drug therapy: Secondary | ICD-10-CM | POA: Diagnosis not present

## 2017-08-08 DIAGNOSIS — R531 Weakness: Secondary | ICD-10-CM | POA: Diagnosis not present

## 2017-08-08 LAB — CBC
HCT: 42.5 % (ref 35.0–47.0)
Hemoglobin: 14 g/dL (ref 12.0–16.0)
MCH: 30.6 pg (ref 26.0–34.0)
MCHC: 32.9 g/dL (ref 32.0–36.0)
MCV: 92.8 fL (ref 80.0–100.0)
PLATELETS: 224 10*3/uL (ref 150–440)
RBC: 4.58 MIL/uL (ref 3.80–5.20)
RDW: 12.8 % (ref 11.5–14.5)
WBC: 6.4 10*3/uL (ref 3.6–11.0)

## 2017-08-08 LAB — BASIC METABOLIC PANEL
Anion gap: 7 (ref 5–15)
BUN: 21 mg/dL — ABNORMAL HIGH (ref 6–20)
CHLORIDE: 105 mmol/L (ref 101–111)
CO2: 27 mmol/L (ref 22–32)
CREATININE: 0.74 mg/dL (ref 0.44–1.00)
Calcium: 8.8 mg/dL — ABNORMAL LOW (ref 8.9–10.3)
GFR calc Af Amer: >60 mL/min (ref >60)
GFR calc non Af Amer: >60 mL/min (ref >60)
GLUCOSE: 132 mg/dL — AB (ref 65–99)
Potassium: 3.8 mmol/L (ref 3.5–5.1)
SODIUM: 139 mmol/L (ref 135–145)

## 2017-08-08 LAB — TROPONIN I: Troponin I: <0.03 ng/mL (ref <0.03)

## 2017-08-08 NOTE — ED Triage Notes (Signed)
Pt arrives POV to triage with c/o chest pain that started x on Monday. Pt reports mid chest but denies other cardiac symptoms at this time. Pt is in NAD at this time.

## 2017-08-08 NOTE — ED Notes (Signed)
Family to stat desk to ask about wait time. Family given update on wait time.

## 2017-08-09 ENCOUNTER — Emergency Department
Admission: EM | Admit: 2017-08-09 | Discharge: 2017-08-09 | Disposition: A | Discharge status: Home / Self Care | Payer: 59 | Attending: Emergency Medicine | Admitting: Emergency Medicine

## 2017-08-09 DIAGNOSIS — R079 Chest pain, unspecified: Secondary | ICD-10-CM

## 2017-08-09 MED ORDER — FAMOTIDINE 40 MG PO TABS: 40.0000 mg | ORAL_TABLET | Freq: Every evening | ORAL | 0 refills | Status: DC

## 2017-08-09 MED ORDER — FAMOTIDINE 20 MG PO TABS
40.0000 mg | ORAL_TABLET | Freq: Once | ORAL | Status: AC
Start: 2017-08-09 — End: 2017-08-09
  Administered 2017-08-09: 40 mg via ORAL
  Filled 2017-08-09: qty 2

## 2017-08-09 NOTE — ED Notes (Signed)
Pt reports chest pain that started on Monday. Husband at bedside.

## 2017-08-09 NOTE — ED Notes (Signed)
Family to stat desk asking about wait time. Family given update on wait time.  

## 2017-08-09 NOTE — Discharge Instructions (Signed)
Please follow up with your primary care physician and cardiology for further evaluation of your chest pain.

## 2017-08-09 NOTE — ED Provider Notes (Signed)
[facility] Emergency Department Provider Note   ____________________________________________   First MD Initiated Contact with Patient 08/09/17 0102     (approximate)  I have reviewed the triage vital signs and the nursing notes.   HISTORY  Chief Complaint Chest Pain    HPI Julia Diaz is a 57 y.o. female who comes into the hospital today with some chest pressure.  She reports that it started a couple of days ago.  The patient states that it comes and goes.  Right now she is not having any pain.  She reports that she thinks it comes on when she eats.  The patient has had some shortness of breath with sweats and lightheadedness but denies nausea or vomiting.  She also feels weak.  She has not taken anything for this pain at home.  She reports that when it comes she also burps and it lasts about an hour.  The patient is never had this before.  She states it is like a pressure right in the middle of her chest.  The patient was concerned so she decided to come into the hospital today for evaluation.   Past Medical History:  Diagnosis Date  . Arthritis   . Ear infection   . Hypertension   . Thyroid disease     Patient Active Problem List   Diagnosis Date Noted  . Choledocholithiasis 03/05/2016  . Acute cholecystitis   . Right sided sciatica 02/27/2015    Past Surgical History:  Procedure Laterality Date  . BREAST SURGERY  02/22/11   atypical ductyl hyperplasia of left breast  . CHOLECYSTECTOMY N/A 03/05/2016   Procedure: LAPAROSCOPIC CHOLECYSTECTOMY WITH INTRAOPERATIVE CHOLANGIOGRAM;  Surgeon: Lattie Haw, MD;  Location: [facility] ORS;  Service: General;  Laterality: N/A;  . KNEE SURGERY     right    Prior to Admission medications   Medication Sig Start Date End Date Taking? Authorizing Provider  famotidine (PEPCID) 40 MG tablet Take 1 tablet (40 mg total) by mouth every evening. 08/09/17 08/09/18  Rebecka Apley, MD  ibuprofen  (ADVIL,MOTRIN) 200 MG tablet Take 200 mg by mouth every 6 (six) hours as needed.    [provider]  levothyroxine (SYNTHROID, LEVOTHROID) 75 MCG tablet Take 75 mcg by mouth daily.      [provider]  valsartan-hydrochlorothiazide (DIOVAN-HCT) 80-12.5 MG tablet Take 1 tablet by mouth daily. 02/08/16   [provider]    Allergies Aspirin  No family history on file.  Social History Social History   Tobacco Use  . Smoking status: Never Smoker  . Smokeless tobacco: Never Used  Substance Use Topics  . Alcohol use: Yes    Comment: socially  . Drug use: No    Review of Systems  Constitutional: No fever/chills Eyes: No visual changes. ENT: No sore throat. Cardiovascular:  chest pain. Respiratory:  shortness of breath. Gastrointestinal: No abdominal pain.  No nausea, no vomiting.  No diarrhea.  No constipation. Genitourinary: Negative for dysuria. Musculoskeletal: Negative for back pain. Skin: Negative for rash. Neurological: Dizzy and lightheaded   ____________________________________________   PHYSICAL EXAM:  VITAL SIGNS: ED Triage Vitals  Enc Vitals Group     BP 08/08/17 1914 (!) 156/87     Pulse Rate 08/08/17 1914 76     Resp 08/08/17 1914 18     Temp 08/08/17 1914 98.3 F (36.8 C)     Temp Source 08/08/17 1914 Oral     SpO2 08/08/17 1914 100 %  Weight 08/08/17 1914 186 lb (84.4 kg)     Height 08/08/17 1914 5\' 7"  (1.702 m)     Head Circumference --      Peak Flow --      Pain Score 08/08/17 1923 6     Pain Loc --      Pain Edu? --      Excl. in GC? --     Constitutional: Alert and oriented. Well appearing and in mild distress. Eyes: Conjunctivae are normal. PERRL. EOMI. Head: Atraumatic. Nose: No congestion/rhinnorhea. Mouth/Throat: Mucous membranes are moist.  Oropharynx non-erythematous. Cardiovascular: Normal rate, regular rhythm. Grossly normal heart sounds.  Good peripheral circulation. Respiratory: Normal respiratory  effort.  No retractions. Lungs CTAB. Gastrointestinal: Soft and nontender. No distention. Positive bowel sounds Musculoskeletal: No lower extremity tenderness nor edema.   Neurologic:  Normal speech and language.  Skin:  Skin is warm, dry and intact.  Psychiatric: Mood and affect are normal.   ____________________________________________   LABS (all labs ordered are listed, but only abnormal results are displayed)  Labs Reviewed  BASIC METABOLIC PANEL - Abnormal; Notable for the following components:      Result Value   Glucose, Bld 132 (*)    BUN 21 (*)    Calcium 8.8 (*)    All other components within normal limits  CBC  TROPONIN I  TROPONIN I   ____________________________________________  EKG  ED ECG REPORT I, Rebecka Apley, the attending physician, personally viewed and interpreted this ECG.   Date: 08/08/2017  EKG Time: 1922  Rate: 84  Rhythm: normal sinus rhythm  Axis: normal  Intervals:none  ST&T Change: none  ____________________________________________  RADIOLOGY  ED MD interpretation:  CXR: No acute disease  Official radiology report(s): Dg Chest 2 View  Result Date: 08/08/2017 CLINICAL DATA:  Chest pain. EXAM: CHEST  2 VIEW COMPARISON:  Chest x-ray dated May 11, 2011. FINDINGS: The heart size and mediastinal contours are within normal limits. Normal pulmonary vascularity. Unchanged calcified granuloma in the left lower lobe. No focal consolidation, pleural effusion, or pneumothorax. No acute osseous abnormality. IMPRESSION: No active cardiopulmonary disease. Electronically Signed   By: Obie Dredge M.D.   On: 08/08/2017 19:46    ____________________________________________   PROCEDURES  Procedure(s) performed: None  Procedures  Critical Care performed: No  ____________________________________________   INITIAL IMPRESSION / ASSESSMENT AND PLAN / ED COURSE  As part of my medical decision making, I reviewed the following data  within the electronic MEDICAL RECORD NUMBER Notes from prior ED visits and [place] Controlled Substance Database   This is a 57 year old female who comes into the hospital today with some chest pain.  She has had it on and off for the past 4 days.  The patient was concerned so she decided to come in for evaluation.  My differential diagnosis includes acute coronary syndrome, nonspecific chest pain, gastritis or esophagitis.  I did check some blood work to include a CBC BMP and troponin.  The patient's initial troponin was negative.  The patient also had a chest x-ray which was negative.  I did go in and speak with the patient and discussed the possibility of gastritis versus acute coronary syndrome.  We talked about repeating her troponin to evaluate for heart disease and following up with cardiology.  The patient seemed agreeable to the plan when I left the room but when the nurse went in to redraw her troponin the patient refused to get her blood redrawn.  The patient  states that she just wants her prescription and she wants to follow-up with her doctor.  Since the patient will not repeat the troponin I cannot fully evaluate for cardiac cause of her chest pain.  The patient's pain is gone at this time but she will sign out AGAINST MEDICAL ADVICE and follow-up with her doctor.      ____________________________________________   FINAL CLINICAL IMPRESSION(S) / ED DIAGNOSES  Final diagnoses:  Chest pain, unspecified type     ED Discharge Orders        Ordered    famotidine (PEPCID) 40 MG tablet  Every evening     08/09/17 0242       Note:  This document was prepared using Dragon voice recognition software and may include unintentional dictation errors.    Rebecka ApleyWebster, Allison P, MD 08/09/17 [phone]

## 2017-08-09 NOTE — ED Notes (Signed)
Pt refuses blood work. States she does not want me to pull another tube because she doesn't feel like waiting.

## 2017-08-11 ENCOUNTER — Telehealth: Payer: Self-pay

## 2017-08-11 NOTE — Telephone Encounter (Signed)
Lmov for patient to call back and schedule ED fu apointment   They were seen there on 08/08/17   Will try again at a later time

## 2017-08-12 NOTE — Telephone Encounter (Signed)
Schedule with dr. Okey DupreEnd 2/27

## 2017-08-20 ENCOUNTER — Encounter: Payer: Self-pay | Admitting: Internal Medicine

## 2017-08-20 ENCOUNTER — Ambulatory Visit (INDEPENDENT_AMBULATORY_CARE_PROVIDER_SITE_OTHER): Payer: 59 | Admitting: Internal Medicine

## 2017-08-20 VITALS — BP 124/84 | HR 72 | Ht 66.0 in | Wt 186.2 lb

## 2017-08-20 DIAGNOSIS — I340 Nonrheumatic mitral (valve) insufficiency: Secondary | ICD-10-CM | POA: Diagnosis not present

## 2017-08-20 DIAGNOSIS — R0789 Other chest pain: Secondary | ICD-10-CM | POA: Diagnosis not present

## 2017-08-20 DIAGNOSIS — I10 Essential (primary) hypertension: Secondary | ICD-10-CM | POA: Diagnosis not present

## 2017-08-20 DIAGNOSIS — R002 Palpitations: Secondary | ICD-10-CM | POA: Diagnosis not present

## 2017-08-20 MED ORDER — RANITIDINE HCL 150 MG PO TABS: 150.0000 mg | ORAL_TABLET | Freq: Two times a day (BID) | ORAL | 2 refills | Status: DC

## 2017-08-20 NOTE — Patient Instructions (Signed)
Medication Instructions:  Your physician has recommended you make the following change in your medication:  1- TAKE Ranitidine 150 mg by mouth two times a day.  Labwork: none  Testing/Procedures: Your physician has requested that you have an exercise tolerance test. For further information please visit [email or web address]. Please also follow instruction sheet, as given.   DO NOT drink or eat foods with caffeine for 24 hours before the test. (Chocolate, coffee, tea, decaf coffee/tea, or energy drinks)  DO NOT smoke for 4 hours before your test.  If you use an inhaler, bring it with you to the test.  Wear comfortable shoes and clothing. Women do not wear dresses.   Follow-Up: Your physician recommends that you schedule a follow-up appointment in: 1 MONTH WITH APP.     Exercise Stress Electrocardiogram An exercise stress electrocardiogram is a test to check how blood flows to your heart. It is done to find areas of poor blood flow. You will need to walk on a treadmill for this test. The electrocardiogram will record your heartbeat when you are at rest and when you are exercising. What happens before the procedure?  Do not have drinks with caffeine or foods with caffeine for 24 hours before the test, or as told by your doctor. This includes coffee, tea (even decaf tea), sodas, chocolate, and cocoa.  Follow your doctor's instructions about eating and drinking before the test.  Ask your doctor what medicines you should or should not take before the test. Take your medicines with water unless told by your doctor not to.  If you use an inhaler, bring it with you to the test.  Bring a snack to eat after the test.  Do not  smoke for 4 hours before the test.  Do not put lotions, powders, creams, or oils on your chest before the test.  Wear comfortable shoes and clothing. What happens during the procedure?  You will have patches put on your chest. Small areas of your chest may need  to be shaved. Wires will be connected to the patches.  Your heart rate will be watched while you are resting and while you are exercising.  You will walk on the treadmill. The treadmill will slowly get faster to raise your heart rate.  The test will take about 1-2 hours. What happens after the procedure?  Your heart rate and blood pressure will be watched after the test.  You may return to your normal diet, activities, and medicines or as told by your doctor. This information is not intended to replace advice given to you by your health care provider. Make sure you discuss any questions you have with your health care provider. Document Released: 11/27/2007 Document Revised: 02/07/2016 Document Reviewed: 02/15/2013 Elsevier Interactive Patient Education  [employer]2018 Elsevier Inc.

## 2017-08-20 NOTE — Progress Notes (Signed)
New Outpatient Visit Date: 08/20/2017  Primary Care provider: Margaretann Loveless, MD [address] [place] [address]  Chief Complaint: Chest pain and palpitations  HPI:  Julia Diaz is a 57 y.o. female who is being seen today for the evaluation of chest pain following her emergency department visit earlier this month.  She has a history of hypertension, hypothyroidism, and arthritis.  She presented to the [facility] emergency department on 08/09/17 with several days of waxing and waning chest pressure.  She is related the discomfort to eating and also endorsed accompanying shortness of breath, diaphoresis, and lightheadedness.  Initial workup was unrevealing, though the patient subsequently left against medical advice.  Today, Ms. Streck reports 2 distinct episodes over the last few weeks.  She initially had substernal chest pressure and heaviness with accompanying shortness of breath.  It could be related to eating and sometimes improved after belching.  This is actually gotten better after her ED visit earlier in the month.  However, this morning she had an episode of palpitations during which it felt as though her heart were racing for a few minutes.  She had accompanying shortness of breath but no chest pain.  The episode resolved spontaneously.  This is the first time she has had palpitations like this.  Ms. Yero denies a history of cardiac disease.  She underwent transthoracic echocardiogram by Dr. Welton Flakes last year, which showed normal LV size and function with grade 1 diastolic dysfunction.  Moderate left and mild right atrial enlargement, and mitral annular calcification with mild to moderate regurgitation.  She has not had any lightheadedness, orthopnea, PND, or edema.  Ms. Bacchi notes that she typically consumes a lot of caffeine but has been trying to cut down.  Currently, she is drinking 2 cups of caffeinated coffee per day.  She works as a Arboriculturist for [employer].  --------------------------------------------------------------------------------------------------  Cardiovascular History & Procedures: Cardiovascular Problems:  Chest pain  Risk Factors:  Hypertension  Cath/PCI:  None  CV Surgery:  None  EP Procedures and Devices:  None  Non-Invasive Evaluation(s):  TTE (10/04/16, Dr. Welton Flakes): Normal LV size and contraction with grade 1 diastolic dysfunction.  Moderate left and mild right atrial enlargement.  Mitral annular calcification with mild to moderate MR.  Mild TR.  Normal RV size and function.  Normal PA pressure.  By 2 cm hypoechoic structure in the liver, suggestive of a cyst.  Recent CV Pertinent Labs: Lab Results  Component Value Date   K 3.8 08/08/2017   BUN 21 (H) 08/08/2017   CREATININE 0.74 08/08/2017    --------------------------------------------------------------------------------------------------  Past Medical History:  Diagnosis Date  . Arthritis   . Ear infection   . Hypertension   . Thyroid disease     Past Surgical History:  Procedure Laterality Date  . BREAST SURGERY  02/22/11   atypical ductyl hyperplasia of left breast  . CHOLECYSTECTOMY N/A 03/05/2016   Procedure: LAPAROSCOPIC CHOLECYSTECTOMY WITH INTRAOPERATIVE CHOLANGIOGRAM;  Surgeon: Lattie Haw, MD;  Location: [facility] ORS;  Service: General;  Laterality: N/A;  . KNEE SURGERY     right    Current Meds  Medication Sig  . levothyroxine (SYNTHROID, LEVOTHROID) 75 MCG tablet Take 75 mcg by mouth daily.    . valsartan-hydrochlorothiazide (DIOVAN-HCT) 80-12.5 MG tablet Take 1 tablet by mouth daily.    Allergies: Aspirin  Social History   Socioeconomic History  . Marital status: Married    Spouse name: Not on file  . Number  of children: Not on file  . Years of education: Not on file  . Highest education level: Not on file  Social Needs  . Financial resource strain: Not on file  . Food insecurity - worry: Not on file  . Food  insecurity - inability: Not on file  . Transportation needs - medical: Not on file  . Transportation needs - non-medical: Not on file  Occupational History  . Not on file  Tobacco Use  . Smoking status: Never Smoker  . Smokeless tobacco: Never Used  Substance and Sexual Activity  . Alcohol use: Yes    Comment: Few times a year  . Drug use: No  . Sexual activity: Not on file  Other Topics Concern  . Not on file  Social History Narrative  . Not on file    Family History  Problem Relation Age of Onset  . Hypertension Mother   . Hypertension Father   . Diabetes Father     Review of Systems: A 12-system review of systems was performed and was negative except as noted in the HPI.  --------------------------------------------------------------------------------------------------  Physical Exam: BP 124/84 (BP Location: Right Arm, Patient Position: Sitting, Cuff Size: Normal)   Pulse 72   Ht 5\' 6"  (1.676 m)   Wt 186 lb 4 oz (84.5 kg)   BMI 30.06 kg/m   General: Obese woman, seated comfortably in the exam room. HEENT: No conjunctival pallor or scleral icterus. Moist mucous membranes. OP clear. Neck: Supple without lymphadenopathy, thyromegaly, JVD, or HJR. No carotid bruit. Lungs: Normal work of breathing. Clear to auscultation bilaterally without wheezes or crackles. Heart: Regular rate and rhythm without murmurs, rubs, or gallops. Non-displaced PMI. Abd: Bowel sounds present. Soft, NT/ND without hepatosplenomegaly Ext: No lower extremity edema. Radial, PT, and DP pulses are 2+ bilaterally Skin: Warm and dry without rash. Neuro: CNIII-XII intact. Strength and fine-touch sensation intact in upper and lower extremities bilaterally. Psych: Normal mood and affect.  EKG: Normal sinus rhythm without abnormalities.  Lab Results  Component Value Date   WBC 6.4 08/08/2017   HGB 14.0 08/08/2017   HCT 42.5 08/08/2017   MCV 92.8 08/08/2017   PLT 224 08/08/2017    Lab Results    Component Value Date   NA 139 08/08/2017   K 3.8 08/08/2017   CL 105 08/08/2017   CO2 27 08/08/2017   BUN 21 (H) 08/08/2017   CREATININE 0.74 08/08/2017   GLUCOSE 132 (H) 08/08/2017   ALT 188 (H) 03/07/2016    No results found for: CHOL, HDL, LDLCALC, LDLDIRECT, TRIG, CHOLHDL   --------------------------------------------------------------------------------------------------  ASSESSMENT AND PLAN: Atypical chest pain Symptoms  often occur after eating, which may represent a GI component.  Cardiac risk factors include hypertension and obesity.  EKG today is normal.  We have agreed to obtain an exercise tolerance test for further assessment.  I have recommended that Ms. Corey HaroldSantana try ranitidine 150 mg twice daily for empiric treatment of GERD.  Palpitations Only a single episode reported, which occurred earlier today.  EKG today is normal.  We have agreed to defer ambulatory event monitoring.  However, if she has recurrent palpitations, we will proceed with event monitoring.  She is certainly at risk for an atrial arrhythmia, given moderate left atrial enlargement and mild to moderate mitral regurgitation noted on echo by Dr. Welton FlakesKhan last year.  I encouraged Ms. Mauceri to limit/cut out caffeine consumption.  Mitral regurgitation Incidentally noted on echocardiogram last year by Dr. Welton FlakesKhan.  No signs or  symptoms of heart failure.  Continue medical therapy including blood pressure control.  Hypertension Diastolic blood pressure borderline elevated.  Sodium restriction encouraged.  Continue current dose of valsartan/HCTZ.  Follow-up: Return to clinic in 1 month.  Yvonne Kendall, MD 08/20/2017 3:17 PM

## 2017-08-22 ENCOUNTER — Telehealth: Payer: Self-pay | Admitting: Internal Medicine

## 2017-08-22 NOTE — Telephone Encounter (Signed)
Called patient to review GXT instructions. No answer, no voice mail.

## 2017-08-25 NOTE — Telephone Encounter (Signed)
LMOV to reschedule treadmill appt Was to offer appt time of 3/15 at 2p if patient is available

## 2017-09-10 DIAGNOSIS — R269 Unspecified abnormalities of gait and mobility: Secondary | ICD-10-CM | POA: Insufficient documentation

## 2017-09-10 DIAGNOSIS — M1990 Unspecified osteoarthritis, unspecified site: Secondary | ICD-10-CM | POA: Insufficient documentation

## 2017-09-10 DIAGNOSIS — M79673 Pain in unspecified foot: Secondary | ICD-10-CM | POA: Insufficient documentation

## 2017-09-10 DIAGNOSIS — M6281 Muscle weakness (generalized): Secondary | ICD-10-CM | POA: Insufficient documentation

## 2017-09-10 DIAGNOSIS — K649 Unspecified hemorrhoids: Secondary | ICD-10-CM | POA: Insufficient documentation

## 2017-09-22 ENCOUNTER — Ambulatory Visit: Payer: 59 | Admitting: Physician Assistant

## 2017-09-25 ENCOUNTER — Encounter: Payer: Self-pay | Admitting: Internal Medicine

## 2017-10-29 ENCOUNTER — Other Ambulatory Visit: Payer: Self-pay | Admitting: Internal Medicine

## 2017-10-29 DIAGNOSIS — R109 Unspecified abdominal pain: Secondary | ICD-10-CM

## 2017-10-31 ENCOUNTER — Ambulatory Visit
Admission: RE | Admit: 2017-10-31 | Discharge: 2017-10-31 | Disposition: A | Discharge status: Home / Self Care | Payer: 59 | Source: Ambulatory Visit | Attending: Internal Medicine | Admitting: Internal Medicine

## 2017-10-31 DIAGNOSIS — K760 Fatty (change of) liver, not elsewhere classified: Secondary | ICD-10-CM | POA: Diagnosis not present

## 2017-10-31 DIAGNOSIS — R109 Unspecified abdominal pain: Secondary | ICD-10-CM | POA: Diagnosis present

## 2017-10-31 DIAGNOSIS — K439 Ventral hernia without obstruction or gangrene: Secondary | ICD-10-CM | POA: Diagnosis not present

## 2017-11-20 ENCOUNTER — Other Ambulatory Visit: Payer: Self-pay | Admitting: Surgery

## 2017-11-20 DIAGNOSIS — K439 Ventral hernia without obstruction or gangrene: Secondary | ICD-10-CM

## 2017-12-10 ENCOUNTER — Ambulatory Visit: Admission: RE | Admit: 2017-12-10 | Payer: 59 | Source: Ambulatory Visit

## 2018-02-16 ENCOUNTER — Other Ambulatory Visit: Payer: Self-pay | Admitting: Internal Medicine

## 2018-02-16 DIAGNOSIS — Z1231 Encounter for screening mammogram for malignant neoplasm of breast: Secondary | ICD-10-CM

## 2018-03-27 ENCOUNTER — Ambulatory Visit
Admission: RE | Admit: 2018-03-27 | Discharge: 2018-03-27 | Disposition: A | Discharge status: Home / Self Care | Payer: 59 | Source: Ambulatory Visit | Attending: Internal Medicine | Admitting: Internal Medicine

## 2018-03-27 DIAGNOSIS — Z1231 Encounter for screening mammogram for malignant neoplasm of breast: Secondary | ICD-10-CM

## 2018-12-31 ENCOUNTER — Other Ambulatory Visit: Payer: Self-pay | Admitting: *Deleted

## 2018-12-31 DIAGNOSIS — Z20822 Contact with and (suspected) exposure to covid-19: Secondary | ICD-10-CM

## 2019-01-05 LAB — NOVEL CORONAVIRUS, NAA: SARS-CoV-2, NAA: NOT DETECTED

## 2019-02-22 ENCOUNTER — Other Ambulatory Visit: Payer: Self-pay | Admitting: Internal Medicine

## 2019-02-22 DIAGNOSIS — Z1231 Encounter for screening mammogram for malignant neoplasm of breast: Secondary | ICD-10-CM

## 2019-03-22 ENCOUNTER — Other Ambulatory Visit: Payer: Self-pay

## 2019-03-22 DIAGNOSIS — Z20822 Contact with and (suspected) exposure to covid-19: Secondary | ICD-10-CM

## 2019-03-23 LAB — NOVEL CORONAVIRUS, NAA: SARS-CoV-2, NAA: NOT DETECTED

## 2019-03-24 ENCOUNTER — Telehealth: Payer: Self-pay | Admitting: General Practice

## 2019-03-24 NOTE — Telephone Encounter (Signed)
Negative COVID results given. Patient results "NOT Detected." Caller expressed understanding. ° °

## 2019-04-06 ENCOUNTER — Other Ambulatory Visit: Payer: Self-pay

## 2019-04-06 ENCOUNTER — Ambulatory Visit
Admission: RE | Admit: 2019-04-06 | Discharge: 2019-04-06 | Disposition: A | Discharge status: Home / Self Care | Payer: BC Managed Care – PPO | Source: Ambulatory Visit | Attending: Internal Medicine | Admitting: Internal Medicine

## 2019-04-06 DIAGNOSIS — Z1231 Encounter for screening mammogram for malignant neoplasm of breast: Secondary | ICD-10-CM

## 2019-06-11 ENCOUNTER — Ambulatory Visit: Payer: BC Managed Care – PPO | Attending: Internal Medicine

## 2019-06-11 DIAGNOSIS — Z20822 Contact with and (suspected) exposure to covid-19: Secondary | ICD-10-CM

## 2019-06-12 LAB — NOVEL CORONAVIRUS, NAA: SARS-CoV-2, NAA: NOT DETECTED

## 2020-03-06 ENCOUNTER — Other Ambulatory Visit: Payer: Self-pay | Admitting: Internal Medicine

## 2020-03-06 DIAGNOSIS — Z1231 Encounter for screening mammogram for malignant neoplasm of breast: Secondary | ICD-10-CM

## 2020-04-10 ENCOUNTER — Other Ambulatory Visit: Payer: Self-pay

## 2020-04-10 ENCOUNTER — Ambulatory Visit
Admission: RE | Admit: 2020-04-10 | Discharge: 2020-04-10 | Disposition: A | Discharge status: Home / Self Care | Payer: BC Managed Care – PPO | Source: Ambulatory Visit | Attending: Internal Medicine | Admitting: Internal Medicine

## 2020-04-10 DIAGNOSIS — Z1231 Encounter for screening mammogram for malignant neoplasm of breast: Secondary | ICD-10-CM

## 2021-03-09 ENCOUNTER — Other Ambulatory Visit: Payer: Self-pay | Admitting: Internal Medicine

## 2021-03-09 DIAGNOSIS — Z1231 Encounter for screening mammogram for malignant neoplasm of breast: Secondary | ICD-10-CM

## 2021-04-12 ENCOUNTER — Other Ambulatory Visit: Payer: Self-pay

## 2021-04-12 ENCOUNTER — Ambulatory Visit
Admission: RE | Admit: 2021-04-12 | Discharge: 2021-04-12 | Disposition: A | Discharge status: Home / Self Care | Payer: BC Managed Care – PPO | Source: Ambulatory Visit | Attending: Internal Medicine | Admitting: Internal Medicine

## 2021-04-12 DIAGNOSIS — Z1231 Encounter for screening mammogram for malignant neoplasm of breast: Secondary | ICD-10-CM

## 2021-05-08 ENCOUNTER — Other Ambulatory Visit: Payer: Self-pay | Admitting: General Surgery

## 2021-05-08 ENCOUNTER — Other Ambulatory Visit (HOSPITAL_COMMUNITY): Payer: Self-pay | Admitting: General Surgery

## 2021-05-08 ENCOUNTER — Ambulatory Visit: Payer: Self-pay | Admitting: General Surgery

## 2021-05-08 DIAGNOSIS — K432 Incisional hernia without obstruction or gangrene: Secondary | ICD-10-CM

## 2021-05-24 ENCOUNTER — Other Ambulatory Visit: Payer: Self-pay

## 2021-05-24 ENCOUNTER — Ambulatory Visit
Admission: RE | Admit: 2021-05-24 | Discharge: 2021-05-24 | Disposition: A | Discharge status: Home / Self Care | Payer: BC Managed Care – PPO | Source: Ambulatory Visit | Attending: General Surgery | Admitting: General Surgery

## 2021-05-24 DIAGNOSIS — K432 Incisional hernia without obstruction or gangrene: Secondary | ICD-10-CM | POA: Insufficient documentation

## 2021-06-13 ENCOUNTER — Ambulatory Visit: Payer: Self-pay | Admitting: General Surgery

## 2021-06-13 NOTE — H&P (Signed)
PATIENT PROFILE: Julia Diaz is a 60 y.o. female who presents to the Clinic for consultation at the request of Dr. Chauncey Mann for evaluation of ventral hernia and hemorrhoids.  PCP: Lamonte Sakai, MD  HISTORY OF PRESENT ILLNESS: Julia Diaz reports she has been having issues with the ventral hernia since many years ago. She endorses that the hernia came from a gallbladder surgery incision. She was evaluated by a surgeon around 5 years ago and surgery was recommended. Due to multiple personal issues she placed that surgery on hold. She endorses that the ventral area has been getting more aggravated. The pain is localized to the upper midline. No pain radiation. She still has multiple abdominal pain complaints that are nonspecific. She cannot associate any alleviating or aggravating factors. She denies any abdominal incision nausea or vomiting.  She also complain of having hemorrhoids. Hemorrhoids have also been for many years. She endorses that she has localized perianal pain from the hemorrhoids. No pain radiation. Pain aggravated by constipation and stress. Alleviating factor is over-the-counter Preparation H cream. She also endorses having intermittent bleeding. Patient endorses having chronic constipation.  PROBLEM LIST: Problem List Date Reviewed: 03/30/2019  Noted  Right sided sciatica 02/27/2015   GENERAL REVIEW OF SYSTEMS:   General ROS: negative for - chills, fatigue, fever, weight gain or weight loss Allergy and Immunology ROS: negative for - hives  Hematological and Lymphatic ROS: negative for - bleeding problems or bruising, negative for palpable nodes Endocrine ROS: negative for - heat or cold intolerance, hair changes Respiratory ROS: negative for - cough, shortness of breath or wheezing Cardiovascular ROS: no chest pain or palpitations GI ROS: Positive for nausea, vomiting, abdominal pain, constipation Musculoskeletal ROS: negative for - joint swelling or muscle  pain Neurological ROS: negative for - confusion, syncope Dermatological ROS: negative for pruritus and rash Psychiatric: negative for anxiety, depression, difficulty sleeping and memory loss  MEDICATIONS: Current Outpatient Medications  Medication Sig Dispense Refill   fluticasone propionate (FLONASE) 50 mcg/actuation nasal spray Place 2 sprays into both nostrils once daily   losartan-hydrochlorothiazide (HYZAAR) 100-12.5 mg tablet Take 1 tablet by mouth once daily   SYNTHROID 150 mcg tablet TAKE 1 TABLET 6 DAYS PER WEEK AND 1.5 TABLETS ON SUNDAYS 96 tablet 3   No current facility-administered medications for this visit.   ALLERGIES: Aspirin  PAST MEDICAL HISTORY: Past Medical History:  Diagnosis Date   COVID-19 01/2021   Hypertension   Hypothyroidism   PAST SURGICAL HISTORY: Past Surgical History:  Procedure Laterality Date   CHOLECYSTECTOMY   COLONOSCOPY 05/11/2019  Tubular adenoma of the colon/Repeat 72yr/TKT   INCISIONAL BIOPSY BREAST Left   KNEE ARTHROSCOPY    FAMILY HISTORY: Family History  Problem Relation Age of Onset   Diabetes Father   No Known Problems Mother   Diabetes Sister    SOCIAL HISTORY: Social History   Socioeconomic History   Marital status: Married  Occupational History   Occupation: [employer]  Tobacco Use   Smoking status: Never   Smokeless tobacco: Never  Vaping Use   Vaping Use: Never used  Substance and Sexual Activity   Alcohol use: Yes  Comment: occasionally   Drug use: No   Sexual activity: Yes  Partners: Male  Birth control/protection: None, Post-menopausal   PHYSICAL EXAM: Vitals:  05/08/21 0935  BP: (!) 160/83  Pulse: 82   Body mass index is 29.29 kg/m. Weight: 84.8 kg (187 lb)   GENERAL: Alert, active, oriented x3  HEENT: Pupils equal reactive  to light. Extraocular movements are intact. Sclera clear. Palpebral conjunctiva normal red color.Pharynx clear.  NECK: Supple with no palpable mass and no  adenopathy.  LUNGS: Sound clear with no rales rhonchi or wheezes.  HEART: Regular rhythm S1 and S2 without murmur.  ABDOMEN: Soft and depressible, nontender with no palpable mass, no hepatomegaly. Small internal and external hemorrhoids. There is an area of induration in the upper abdomen under surgical scar.  RECTAL: adequate rectal tone. No masses, palpable hemorrhoids. No fissures.   EXTREMITIES: Well-developed well-nourished symmetrical with no dependent edema.  NEUROLOGICAL: Awake alert oriented, facial expression symmetrical, moving all extremities.  REVIEW OF DATA: I have reviewed the following data today: Initial consult on 05/08/2021  Component Date Value   WBC (White Blood Cell Co* 05/08/2021 6.7   RBC (Red Blood Cell Coun* 05/08/2021 4.68   Hemoglobin 05/08/2021 14.4   Hematocrit 05/08/2021 44.1   MCV (Mean Corpuscular Vo* 05/08/2021 94.2   MCH (Mean Corpuscular He* 05/08/2021 30.8   MCHC (Mean Corpuscular H* 05/08/2021 32.7   Platelet Count 05/08/2021 278   RDW-CV (Red Cell Distrib* 05/08/2021 12.4   MPV (Mean Platelet Volum* 05/08/2021 11.2   Neutrophils 05/08/2021 4.36   Lymphocytes 05/08/2021 1.74   Monocytes 05/08/2021 0.40   Eosinophils 05/08/2021 0.13   Basophils 05/08/2021 0.02   Neutrophil % 05/08/2021 65.4   Lymphocyte % 05/08/2021 26.1   Monocyte % 05/08/2021 6.0   Eosinophil % 05/08/2021 1.9   Basophil% 05/08/2021 0.3   Immature Granulocyte % 05/08/2021 0.3   Immature Granulocyte Cou* 05/08/2021 0.02   Glucose 05/08/2021 93   Sodium 05/08/2021 139   Potassium 05/08/2021 4.2   Chloride 05/08/2021 103   Carbon Dioxide (CO2) 05/08/2021 32.0   Urea Nitrogen (BUN) 05/08/2021 19   Creatinine 05/08/2021 0.7   Glomerular Filtration Ra* 05/08/2021 85   Calcium 05/08/2021 9.5   AST 05/08/2021 21   ALT 05/08/2021 19   Alk Phos (alkaline Phosp* 05/08/2021 80   Albumin 05/08/2021 4.0   Bilirubin, Total 05/08/2021 0.4   Protein, Total 05/08/2021 7.0    A/G Ratio 05/08/2021 1.3  Appointment on 02/08/2021  Component Date Value   Thyroid Stimulating Horm* 02/08/2021 4.098    ASSESSMENT: Julia Diaz is a 60 y.o. female presenting for consultation for incisional hernia and hemorrhoids.  The patient main complaint of the 2 is a incisional hernia. This is a more aggravating that the hemorrhoids.  I explained the patient about the implication of having a incisional hernia. I discussed with the patient that the recommendation for treatment for incisional hernia is surgical management. I discussed with the patient minimally invasive laparoscopic incisional hernia repair. Due to the multiple complaints that may be the specific I do recommend to have evaluation with a CT scan of the abdomen pelvis for evaluation of hernia size and if there are multiple incisional hernias from the laparoscopic surgery. This will help with the surgical planning especially decision regarding open versus laparoscopic procedure. I discussed with the patient the risk of surgery that includes bleeding, infection, injury to adjacent organ, intra-abdominal infections, among others.   Incisional hernia, without obstruction or gangrene [K43.2]  PLAN: 1. Robotic assisted laparoscopic incisional hernia repair with mesh (90300) 2. CBC, CMP 3. Avoid taking aspirin 5 days before the surgery  4. Contact us if you have any concern.

## 2021-06-13 NOTE — H&P (View-Only) (Signed)
PATIENT PROFILE: Julia Diaz is a 60 y.o. female who presents to the Clinic for consultation at the request of Dr. Chauncey Mann for evaluation of ventral hernia and hemorrhoids.  PCP: Lamonte Sakai, MD  HISTORY OF PRESENT ILLNESS: [name] reports she has been having issues with the ventral hernia since many years ago. She endorses that the hernia came from a gallbladder surgery incision. She was evaluated by a surgeon around 5 years ago and surgery was recommended. Due to multiple personal issues she placed that surgery on hold. She endorses that the ventral area has been getting more aggravated. The pain is localized to the upper midline. No pain radiation. She still has multiple abdominal pain complaints that are nonspecific. She cannot associate any alleviating or aggravating factors. She denies any abdominal incision nausea or vomiting.  She also complain of having hemorrhoids. Hemorrhoids have also been for many years. She endorses that she has localized perianal pain from the hemorrhoids. No pain radiation. Pain aggravated by constipation and stress. Alleviating factor is over-the-counter Preparation H cream. She also endorses having intermittent bleeding. Patient endorses having chronic constipation.  PROBLEM LIST: Problem List Date Reviewed: 03/30/2019  Noted  Right sided sciatica 02/27/2015   GENERAL REVIEW OF SYSTEMS:   General ROS: negative for - chills, fatigue, fever, weight gain or weight loss Allergy and Immunology ROS: negative for - hives  Hematological and Lymphatic ROS: negative for - bleeding problems or bruising, negative for palpable nodes Endocrine ROS: negative for - heat or cold intolerance, hair changes Respiratory ROS: negative for - cough, shortness of breath or wheezing Cardiovascular ROS: no chest pain or palpitations GI ROS: Positive for nausea, vomiting, abdominal pain, constipation Musculoskeletal ROS: negative for - joint swelling or muscle  pain Neurological ROS: negative for - confusion, syncope Dermatological ROS: negative for pruritus and rash Psychiatric: negative for anxiety, depression, difficulty sleeping and memory loss  MEDICATIONS: Current Outpatient Medications  Medication Sig Dispense Refill   fluticasone propionate (FLONASE) 50 mcg/actuation nasal spray Place 2 sprays into both nostrils once daily   losartan-hydrochlorothiazide (HYZAAR) 100-12.5 mg tablet Take 1 tablet by mouth once daily   SYNTHROID 150 mcg tablet TAKE 1 TABLET 6 DAYS PER WEEK AND 1.5 TABLETS ON SUNDAYS 96 tablet 3   No current facility-administered medications for this visit.   ALLERGIES: Aspirin  PAST MEDICAL HISTORY: Past Medical History:  Diagnosis Date   COVID-19 01/2021   Hypertension   Hypothyroidism   PAST SURGICAL HISTORY: Past Surgical History:  Procedure Laterality Date   CHOLECYSTECTOMY   COLONOSCOPY 05/11/2019  Tubular adenoma of the colon/Repeat 72yr/TKT   INCISIONAL BIOPSY BREAST Left   KNEE ARTHROSCOPY    FAMILY HISTORY: Family History  Problem Relation Age of Onset   Diabetes Father   No Known Problems Mother   Diabetes Sister    SOCIAL HISTORY: Social History   Socioeconomic History   Marital status: Married  Occupational History   Occupation: [employer]  Tobacco Use   Smoking status: Never   Smokeless tobacco: Never  Vaping Use   Vaping Use: Never used  Substance and Sexual Activity   Alcohol use: Yes  Comment: occasionally   Drug use: No   Sexual activity: Yes  Partners: Male  Birth control/protection: None, Post-menopausal   PHYSICAL EXAM: Vitals:  05/08/21 0935  BP: (!) 160/83  Pulse: 82   Body mass index is 29.29 kg/m. Weight: 84.8 kg (187 lb)   GENERAL: Alert, active, oriented x3  HEENT: Pupils equal reactive  to light. Extraocular movements are intact. Sclera clear. Palpebral conjunctiva normal red color.Pharynx clear.  NECK: Supple with no palpable mass and no  adenopathy.  LUNGS: Sound clear with no rales rhonchi or wheezes.  HEART: Regular rhythm S1 and S2 without murmur.  ABDOMEN: Soft and depressible, nontender with no palpable mass, no hepatomegaly. Small internal and external hemorrhoids. There is an area of induration in the upper abdomen under surgical scar.  RECTAL: adequate rectal tone. No masses, palpable hemorrhoids. No fissures.   EXTREMITIES: Well-developed well-nourished symmetrical with no dependent edema.  NEUROLOGICAL: Awake alert oriented, facial expression symmetrical, moving all extremities.  REVIEW OF DATA: I have reviewed the following data today: Initial consult on 05/08/2021  Component Date Value   WBC (White Blood Cell Co* 05/08/2021 6.7   RBC (Red Blood Cell Coun* 05/08/2021 4.68   Hemoglobin 05/08/2021 14.4   Hematocrit 05/08/2021 44.1   MCV (Mean Corpuscular Vo* 05/08/2021 94.2   MCH (Mean Corpuscular He* 05/08/2021 30.8   MCHC (Mean Corpuscular H* 05/08/2021 32.7   Platelet Count 05/08/2021 278   RDW-CV (Red Cell Distrib* 05/08/2021 12.4   MPV (Mean Platelet Volum* 05/08/2021 11.2   Neutrophils 05/08/2021 4.36   Lymphocytes 05/08/2021 1.74   Monocytes 05/08/2021 0.40   Eosinophils 05/08/2021 0.13   Basophils 05/08/2021 0.02   Neutrophil % 05/08/2021 65.4   Lymphocyte % 05/08/2021 26.1   Monocyte % 05/08/2021 6.0   Eosinophil % 05/08/2021 1.9   Basophil% 05/08/2021 0.3   Immature Granulocyte % 05/08/2021 0.3   Immature Granulocyte Cou* 05/08/2021 0.02   Glucose 05/08/2021 93   Sodium 05/08/2021 139   Potassium 05/08/2021 4.2   Chloride 05/08/2021 103   Carbon Dioxide (CO2) 05/08/2021 32.0   Urea Nitrogen (BUN) 05/08/2021 19   Creatinine 05/08/2021 0.7   Glomerular Filtration Ra* 05/08/2021 85   Calcium 05/08/2021 9.5   AST 05/08/2021 21   ALT 05/08/2021 19   Alk Phos (alkaline Phosp* 05/08/2021 80   Albumin 05/08/2021 4.0   Bilirubin, Total 05/08/2021 0.4   Protein, Total 05/08/2021 7.0    A/G Ratio 05/08/2021 1.3  Appointment on 02/08/2021  Component Date Value   Thyroid Stimulating Horm* 02/08/2021 4.098    ASSESSMENT: [name] is a 60 y.o. female presenting for consultation for incisional hernia and hemorrhoids.  The patient main complaint of the 2 is a incisional hernia. This is a more aggravating that the hemorrhoids.  I explained the patient about the implication of having a incisional hernia. I discussed with the patient that the recommendation for treatment for incisional hernia is surgical management. I discussed with the patient minimally invasive laparoscopic incisional hernia repair. Due to the multiple complaints that may be the specific I do recommend to have evaluation with a CT scan of the abdomen pelvis for evaluation of hernia size and if there are multiple incisional hernias from the laparoscopic surgery. This will help with the surgical planning especially decision regarding open versus laparoscopic procedure. I discussed with the patient the risk of surgery that includes bleeding, infection, injury to adjacent organ, intra-abdominal infections, among others.   Incisional hernia, without obstruction or gangrene [K43.2]  PLAN: 1. Robotic assisted laparoscopic incisional hernia repair with mesh (90300) 2. CBC, CMP 3. Avoid taking aspirin 5 days before the surgery  4. Contact us if you have any concern.

## 2021-06-19 ENCOUNTER — Other Ambulatory Visit
Admission: RE | Admit: 2021-06-19 | Discharge: 2021-06-19 | Disposition: A | Discharge status: Home / Self Care | Payer: BC Managed Care – PPO | Source: Ambulatory Visit | Attending: General Surgery | Admitting: General Surgery

## 2021-06-19 ENCOUNTER — Other Ambulatory Visit: Payer: Self-pay

## 2021-06-19 NOTE — Patient Instructions (Signed)
Your procedure is scheduled on: Friday July 06, 2020. Report to Day Surgery inside [facility] 2nd floor. To find out your arrival time please call [phone] between 1PM - 3PM on Thursday July 05, 2020.  Remember: Instructions that are not followed completely may result in serious medical risk,  up to and including death, or upon the discretion of your surgeon and anesthesiologist your  surgery may need to be rescheduled.     _X__ 1. Do not eat food or drink fluids after midnight the night before your procedure.                 No chewing gum or hard candies.   __X__2.  On the morning of surgery brush your teeth with toothpaste and water, you                may rinse your mouth with mouthwash if you wish.  Do not swallow any toothpaste or mouthwash.     _X__ 3.  No Alcohol for 24 hours before or after surgery.   _X__ 4.  Do Not Smoke or use e-cigarettes For 24 Hours Prior to Your Surgery.                 Do not use any chewable tobacco products for at least 6 hours prior to                 Surgery.  _X__  5.  Do not use any recreational drugs (marijuana, cocaine, heroin, ecstasy, MDMA or other)                For at least one week prior to your surgery.  Combination of these drugs with anesthesia                May have life threatening results.  __X__ 6.  Notify your doctor if there is any change in your medical condition      (cold, fever, infections).     Do not wear jewelry, make-up, hairpins, clips or nail polish. Do not wear lotions, powders, or perfumes. You may wear deodorant. Do not shave 48 hours prior to surgery. Men may shave face and neck. Do not bring valuables to the hospital.    [facility] is not responsible for any belongings or valuables.  Contacts, dentures or bridgework may not be worn into surgery. Leave your suitcase in the car. After surgery it may be brought to your room. For patients admitted to the hospital, discharge  time is determined by your treatment team.   Patients discharged the day of surgery will not be allowed to drive home.   Make arrangements for someone to be with you for the first 24 hours of your Same Day Discharge.   __X__ Take these medicines the morning of surgery with A SIP OF WATER:    1. levothyroxine (SYNTHROID) 150 MCG   2. omeprazole (PRILOSEC) 20 MG  3.   4.  5.  6.  ____ Fleet Enema (as directed)   __X__ Use CHG Soap (or wipes) as directed  ____ Use Benzoyl Peroxide Gel as instructed  ____ Use inhalers on the day of surgery  ____ Stop metformin 2 days prior to surgery    ____ Take 1/2 of usual insulin dose the night before surgery. No insulin the morning          of surgery.   ____ Call your PCP, cardiologist, or Pulmonologist if taking Coumadin/Plavix/aspirin and ask when to  stop before your surgery.   __X__ One Week prior to surgery- Stop Anti-inflammatories such as Ibuprofen, Aleve, Advil, Motrin, meloxicam (MOBIC), diclofenac, etodolac, ketorolac, Toradol, Daypro, piroxicam, Goody's or BC powders. OK TO USE TYLENOL IF NEEDED   __X__ Do not start any herbal supplements until after surgery.    ____ Bring C-Pap to the hospital.    If you have any questions regarding your pre-procedure instructions,  Please call Pre-admit Testing at [phone]

## 2021-06-27 ENCOUNTER — Other Ambulatory Visit: Payer: BC Managed Care – PPO

## 2021-07-02 ENCOUNTER — Other Ambulatory Visit: Payer: Self-pay

## 2021-07-02 ENCOUNTER — Encounter
Admission: RE | Admit: 2021-07-02 | Discharge: 2021-07-02 | Disposition: A | Discharge status: Home / Self Care | Payer: BC Managed Care – PPO | Source: Ambulatory Visit | Attending: General Surgery | Admitting: General Surgery

## 2021-07-02 DIAGNOSIS — Z01810 Encounter for preprocedural cardiovascular examination: Secondary | ICD-10-CM | POA: Diagnosis not present

## 2021-07-02 DIAGNOSIS — I10 Essential (primary) hypertension: Secondary | ICD-10-CM | POA: Insufficient documentation

## 2021-07-06 ENCOUNTER — Ambulatory Visit: Payer: BC Managed Care – PPO | Admitting: Certified Registered"

## 2021-07-06 ENCOUNTER — Encounter
Admission: RE | Disposition: A | Discharge status: Home / Self Care | Payer: Self-pay | Source: Home / Self Care | Attending: General Surgery

## 2021-07-06 ENCOUNTER — Other Ambulatory Visit: Payer: Self-pay

## 2021-07-06 ENCOUNTER — Encounter: Payer: Self-pay | Admitting: General Surgery

## 2021-07-06 ENCOUNTER — Ambulatory Visit
Admission: RE | Admit: 2021-07-06 | Discharge: 2021-07-06 | Disposition: A | Discharge status: Home / Self Care | Payer: BC Managed Care – PPO | Attending: General Surgery | Admitting: General Surgery

## 2021-07-06 DIAGNOSIS — K5909 Other constipation: Secondary | ICD-10-CM | POA: Insufficient documentation

## 2021-07-06 DIAGNOSIS — K432 Incisional hernia without obstruction or gangrene: Secondary | ICD-10-CM | POA: Diagnosis not present

## 2021-07-06 DIAGNOSIS — Z9049 Acquired absence of other specified parts of digestive tract: Secondary | ICD-10-CM | POA: Diagnosis not present

## 2021-07-06 DIAGNOSIS — E039 Hypothyroidism, unspecified: Secondary | ICD-10-CM | POA: Diagnosis not present

## 2021-07-06 DIAGNOSIS — K644 Residual hemorrhoidal skin tags: Secondary | ICD-10-CM | POA: Diagnosis not present

## 2021-07-06 DIAGNOSIS — Z96651 Presence of right artificial knee joint: Secondary | ICD-10-CM | POA: Insufficient documentation

## 2021-07-06 DIAGNOSIS — I10 Essential (primary) hypertension: Secondary | ICD-10-CM | POA: Insufficient documentation

## 2021-07-06 DIAGNOSIS — K648 Other hemorrhoids: Secondary | ICD-10-CM | POA: Insufficient documentation

## 2021-07-06 DIAGNOSIS — M1990 Unspecified osteoarthritis, unspecified site: Secondary | ICD-10-CM | POA: Diagnosis not present

## 2021-07-06 HISTORY — PX: XI ROBOTIC ASSISTED VENTRAL HERNIA: SHX6789

## 2021-07-06 SURGERY — REPAIR, HERNIA, VENTRAL, ROBOT-ASSISTED
Anesthesia: General | Site: Abdomen

## 2021-07-06 MED ORDER — ONDANSETRON HCL 4 MG/2ML IJ SOLN
INTRAMUSCULAR | Status: DC | PRN
  Administered 2021-07-06: 4 mg via INTRAVENOUS

## 2021-07-06 MED ORDER — CEFAZOLIN SODIUM-DEXTROSE 2-4 GM/100ML-% IV SOLN
2.0000 g | INTRAVENOUS | Status: AC
  Administered 2021-07-06: 2 g via INTRAVENOUS

## 2021-07-06 MED ORDER — DEXAMETHASONE SODIUM PHOSPHATE 10 MG/ML IJ SOLN
INTRAMUSCULAR | Status: DC | PRN
Start: 2021-07-06 — End: 2021-07-06
  Administered 2021-07-06: 8 mg via INTRAVENOUS

## 2021-07-06 MED ORDER — LIDOCAINE HCL (CARDIAC) PF 100 MG/5ML IV SOSY
PREFILLED_SYRINGE | INTRAVENOUS | Status: DC | PRN
  Administered 2021-07-06: 80 mg via INTRAVENOUS

## 2021-07-06 MED ORDER — FENTANYL CITRATE (PF) 100 MCG/2ML IJ SOLN
INTRAMUSCULAR | Status: AC
  Administered 2021-07-06: 25 ug via INTRAVENOUS
  Filled 2021-07-06: qty 2

## 2021-07-06 MED ORDER — PROPOFOL 10 MG/ML IV BOLUS
INTRAVENOUS | Status: AC
  Filled 2021-07-06: qty 20

## 2021-07-06 MED ORDER — PROPOFOL 10 MG/ML IV BOLUS
INTRAVENOUS | Status: DC | PRN
  Administered 2021-07-06: 150 mg via INTRAVENOUS

## 2021-07-06 MED ORDER — EPHEDRINE SULFATE 50 MG/ML IJ SOLN
INTRAMUSCULAR | Status: DC | PRN
  Administered 2021-07-06 (×2): 5 mg via INTRAVENOUS

## 2021-07-06 MED ORDER — ACETAMINOPHEN 10 MG/ML IV SOLN
INTRAVENOUS | Status: AC
  Filled 2021-07-06: qty 100

## 2021-07-06 MED ORDER — 0.9 % SODIUM CHLORIDE (POUR BTL) OPTIME
TOPICAL | Status: DC | PRN
  Administered 2021-07-06: 100 mL

## 2021-07-06 MED ORDER — BUPIVACAINE-EPINEPHRINE (PF) 0.25% -1:200000 IJ SOLN
INTRAMUSCULAR | Status: DC | PRN
  Administered 2021-07-06: 20 mL
  Administered 2021-07-06: 10 mL

## 2021-07-06 MED ORDER — ORAL CARE MOUTH RINSE: 15.0000 mL | Freq: Once | OROMUCOSAL | Status: AC

## 2021-07-06 MED ORDER — MIDAZOLAM HCL 2 MG/2ML IJ SOLN
INTRAMUSCULAR | Status: AC
  Filled 2021-07-06: qty 2

## 2021-07-06 MED ORDER — MIDAZOLAM HCL 2 MG/2ML IJ SOLN
INTRAMUSCULAR | Status: DC | PRN
  Administered 2021-07-06: 2 mg via INTRAVENOUS

## 2021-07-06 MED ORDER — BUPIVACAINE-EPINEPHRINE (PF) 0.25% -1:200000 IJ SOLN
INTRAMUSCULAR | Status: AC
  Filled 2021-07-06: qty 30

## 2021-07-06 MED ORDER — CHLORHEXIDINE GLUCONATE 0.12 % MT SOLN
OROMUCOSAL | Status: AC
  Administered 2021-07-06: 15 mL via OROMUCOSAL
  Filled 2021-07-06: qty 15

## 2021-07-06 MED ORDER — FENTANYL CITRATE (PF) 100 MCG/2ML IJ SOLN
INTRAMUSCULAR | Status: DC | PRN
  Administered 2021-07-06 (×2): 50 ug via INTRAVENOUS

## 2021-07-06 MED ORDER — OXYCODONE HCL 5 MG PO TABS
ORAL_TABLET | ORAL | Status: AC
  Filled 2021-07-06: qty 1

## 2021-07-06 MED ORDER — LACTATED RINGERS IV SOLN: INTRAVENOUS | Status: DC

## 2021-07-06 MED ORDER — HYDROCODONE-ACETAMINOPHEN 5-325 MG PO TABS: 1.0000 | ORAL_TABLET | ORAL | 0 refills | Status: AC | PRN

## 2021-07-06 MED ORDER — FENTANYL CITRATE (PF) 100 MCG/2ML IJ SOLN
INTRAMUSCULAR | Status: AC
  Filled 2021-07-06: qty 2

## 2021-07-06 MED ORDER — ACETAMINOPHEN 10 MG/ML IV SOLN
INTRAVENOUS | Status: DC | PRN
Start: 2021-07-06 — End: 2021-07-06
  Administered 2021-07-06: 1000 mg via INTRAVENOUS

## 2021-07-06 MED ORDER — FENTANYL CITRATE (PF) 100 MCG/2ML IJ SOLN
25.0000 ug | INTRAMUSCULAR | Status: DC | PRN
  Administered 2021-07-06 (×3): 25 ug via INTRAVENOUS

## 2021-07-06 MED ORDER — ONDANSETRON HCL 4 MG/2ML IJ SOLN: 4.0000 mg | Freq: Once | INTRAMUSCULAR | Status: DC | PRN

## 2021-07-06 MED ORDER — SUGAMMADEX SODIUM 200 MG/2ML IV SOLN
INTRAVENOUS | Status: DC | PRN
  Administered 2021-07-06: 200 mg via INTRAVENOUS

## 2021-07-06 MED ORDER — PHENYLEPHRINE HCL (PRESSORS) 10 MG/ML IV SOLN
INTRAVENOUS | Status: DC | PRN
Start: 2021-07-06 — End: 2021-07-06
  Administered 2021-07-06 (×6): 100 ug via INTRAVENOUS

## 2021-07-06 MED ORDER — ROCURONIUM BROMIDE 100 MG/10ML IV SOLN
INTRAVENOUS | Status: DC | PRN
  Administered 2021-07-06: 50 mg via INTRAVENOUS

## 2021-07-06 MED ORDER — CEFAZOLIN SODIUM-DEXTROSE 2-4 GM/100ML-% IV SOLN
INTRAVENOUS | Status: AC
  Filled 2021-07-06: qty 100

## 2021-07-06 MED ORDER — OXYCODONE HCL 5 MG PO TABS
5.0000 mg | ORAL_TABLET | Freq: Once | ORAL | Status: AC
  Administered 2021-07-06: 5 mg via ORAL

## 2021-07-06 MED ORDER — CHLORHEXIDINE GLUCONATE 0.12 % MT SOLN: 15.0000 mL | Freq: Once | OROMUCOSAL | Status: AC

## 2021-07-06 SURGICAL SUPPLY — 45 items
BLADE SURG SZ11 CARB STEEL (BLADE) ×2 IMPLANT
COVER TIP SHEARS 8 DVNC (MISCELLANEOUS) ×1 IMPLANT
COVER TIP SHEARS 8MM DA VINCI (MISCELLANEOUS) ×2
COVER WAND RF STERILE (DRAPES) ×2 IMPLANT
DERMABOND ADVANCED (GAUZE/BANDAGES/DRESSINGS) ×1
DERMABOND ADVANCED .7 DNX12 (GAUZE/BANDAGES/DRESSINGS) ×1 IMPLANT
DRAPE ARM DVNC X/XI (DISPOSABLE) ×3 IMPLANT
DRAPE COLUMN DVNC XI (DISPOSABLE) ×1 IMPLANT
DRAPE DA VINCI XI ARM (DISPOSABLE) ×6
DRAPE DA VINCI XI COLUMN (DISPOSABLE) ×2
ELECT REM PT RETURN 9FT ADLT (ELECTROSURGICAL) ×2
ELECTRODE REM PT RTRN 9FT ADLT (ELECTROSURGICAL) ×1 IMPLANT
GAUZE 4X4 16PLY ~~LOC~~+RFID DBL (SPONGE) ×2 IMPLANT
GLOVE SURG ENC MOIS LTX SZ6.5 (GLOVE) ×4 IMPLANT
GLOVE SURG UNDER POLY LF SZ6.5 (GLOVE) ×4 IMPLANT
GOWN STRL REUS W/ TWL LRG LVL3 (GOWN DISPOSABLE) ×3 IMPLANT
GOWN STRL REUS W/TWL LRG LVL3 (GOWN DISPOSABLE) ×6
IRRIGATOR SUCT 8 DISP DVNC XI (IRRIGATION / IRRIGATOR) IMPLANT
IRRIGATOR SUCTION 8MM XI DISP (IRRIGATION / IRRIGATOR)
IV NS 1000ML (IV SOLUTION)
IV NS 1000ML BAXH (IV SOLUTION) IMPLANT
KIT PINK PAD W/HEAD ARE REST (MISCELLANEOUS) ×2
KIT PINK PAD W/HEAD ARM REST (MISCELLANEOUS) ×1 IMPLANT
LABEL OR SOLS (LABEL) ×2 IMPLANT
MANIFOLD NEPTUNE II (INSTRUMENTS) ×2 IMPLANT
MESH VENTRALIGHT ST 4.5IN (Mesh General) ×1 IMPLANT
NDL INSUFFLATION 14GA 120MM (NEEDLE) ×1 IMPLANT
NEEDLE HYPO 22GX1.5 SAFETY (NEEDLE) ×2 IMPLANT
NEEDLE INSUFFLATION 14GA 120MM (NEEDLE) ×2 IMPLANT
OBTURATOR OPTICAL STANDARD 8MM (TROCAR) ×2
OBTURATOR OPTICAL STND 8 DVNC (TROCAR) ×1
OBTURATOR OPTICALSTD 8 DVNC (TROCAR) ×1 IMPLANT
PACK LAP CHOLECYSTECTOMY (MISCELLANEOUS) ×2 IMPLANT
SEAL CANN UNIV 5-8 DVNC XI (MISCELLANEOUS) ×3 IMPLANT
SEAL XI 5MM-8MM UNIVERSAL (MISCELLANEOUS) ×6
SET TUBE SMOKE EVAC HIGH FLOW (TUBING) ×2 IMPLANT
SOLUTION ELECTROLUBE (MISCELLANEOUS) ×2 IMPLANT
SUT MNCRL AB 4-0 PS2 18 (SUTURE) ×2 IMPLANT
SUT STRATAFIX PDS 30 CT-1 (SUTURE) ×2 IMPLANT
SUT V-LOC 90 ABS 3-0 VLT  V-20 (SUTURE)
SUT V-LOC 90 ABS 3-0 VLT V-20 (SUTURE) IMPLANT
SUT VLOC 90 2/L VL 12 GS22 (SUTURE) ×4 IMPLANT
TAPE TRANSPORE STRL 2 31045 (GAUZE/BANDAGES/DRESSINGS) ×2 IMPLANT
TRAY FOLEY MTR SLVR 16FR STAT (SET/KITS/TRAYS/PACK) IMPLANT
WATER STERILE IRR 500ML POUR (IV SOLUTION) ×1 IMPLANT

## 2021-07-06 NOTE — Discharge Instructions (Addendum)
°  Diet: Resume home heart healthy regular diet.   Activity: No heavy lifting >20 pounds (children, pets, laundry, garbage) or strenuous activity until follow-up, but light activity and walking are encouraged. Do not drive or drink alcohol if taking narcotic pain medications.  Wound care: May shower with soapy water and pat dry (do not rub incisions), but no baths or submerging incision underwater until follow-up. (no swimming)   Medications: Resume all home medications. For mild to moderate pain: acetaminophen (Tylenol) or ibuprofen (if no kidney disease). Combining Tylenol with alcohol can substantially increase your risk of causing liver disease. Narcotic pain medications, if prescribed, can be used for severe pain, though may cause nausea, constipation, and drowsiness. Do not combine Tylenol and Norco within a 6 hour period as Norco contains Tylenol. If you do not need the narcotic pain medication, you do not need to fill the prescription.  Call office [phone]) at any time if any questions, worsening pain, fevers/chills, bleeding, drainage from incision site, or other concerns.   AMBULATORY SURGERY  DISCHARGE INSTRUCTIONS   The drugs that you were given will stay in your system until tomorrow so for the next 24 hours you should not:  Drive an automobile Make any legal decisions Drink any alcoholic beverage   You may resume regular meals tomorrow.  Today it is better to start with liquids and gradually work up to solid foods.  You may eat anything you prefer, but it is better to start with liquids, then soup and crackers, and gradually work up to solid foods.   Please notify your doctor immediately if you have any unusual bleeding, trouble breathing, redness and pain at the surgery site, drainage, fever, or pain not relieved by medication.    Additional Instructions:        Please contact your physician with any problems or Same Day Surgery at [phone], Monday  through Friday 6 am to 4 pm, or [place] at [facility] number at [phone]. AMBULATORY SURGERY  DISCHARGE INSTRUCTIONS   The drugs that you were given will stay in your system until tomorrow so for the next 24 hours you should not:  Drive an automobile Make any legal decisions Drink any alcoholic beverage   You may resume regular meals tomorrow.  Today it is better to start with liquids and gradually work up to solid foods.  You may eat anything you prefer, but it is better to start with liquids, then soup and crackers, and gradually work up to solid foods.   Please notify your doctor immediately if you have any unusual bleeding, trouble breathing, redness and pain at the surgery site, drainage, fever, or pain not relieved by medication.    Your post-operative visit with Dr.                                       is: Date:                        Time:    Please call to schedule your post-operative visit.  Additional Instructions:

## 2021-07-06 NOTE — Op Note (Signed)
Preoperative diagnosis: Incisional Hernia  Postoperative diagnosis: Incisional incarcerated Hernia  Procedure: Robotic assisted laparoscopic incisional incarcerated hernia repair with mesh  Anesthesia: General  Surgeon: Dr. Hazle Quant  Wound Classification: Clean  Specimen: None  Complications: None  Estimated Blood Loss: 24ml  Indications: Patient is a 61 y.o. female developed a ventral hernia. This was symptomatic and incarcerated and repair was indicated.   Findings: 3 cm incarcerated incisional hernia 2. Repair achieved with closure of the anterior fascia at midline and 11.4 cm Bard mesh 3. Adequate hemostasis           Description of procedure: The patient was brought to the operating room and general anesthesia was induced. A time-out was completed verifying correct patient, procedure, site, positioning, and implant(s) and/or special equipment prior to beginning this procedure. Antibiotics were administered prior to making the incision. SCDs placed. The anterior abdominal wall was prepped and draped in the standard sterile fashion.   Palmer's point chosen for entry.  Veress needle placed and abdomen insufflated to 15cm without any dramatic increase in pressure.  8 mm port introduced using Optiview technique in the left lower quadrant.  No injury noted during placement. Two additional ports, 85mm x2 along lower abdomen placed.  Xi robot then docked into place.  Hernia contents noted and reduced with combination of blunt, sharp dissection with scissors and fenestrated forceps.  Hemostasis achieved throughout this portion.  Once all hernia contents reduced, there was noted to be a 3 cm hernia.    Insufflation dropped to 59mm and transfacial suture with 0 stratafix used to primarily close defect under minimal tension. Bard protected 11.4 cm mesh was placed within the abdominal cavity through 49mm port and secured to the abdominal wall centered over the defect using the 0  stratafix previously used to primarily close defect.  The mesh was then circumferentially sutured into the anterior abdominal wall using 2-0 VLock x2.  Any bleeding noted during this portion was no longer actively bleeding by end of securing mesh and tightening the suture.    Robot was undocked.  Abdomen then desufflated while camera within abdomen to ensure no signs of new bleed prior to removing camera and rest of ports completely.  All skin incisions closed with runninrg 4-0 Monocryl in a subcuticular fashion.  All wounds then dressed with Dermabond.  Patient was then successfully awakened and transferred to PACU in stable condition.  At the end of the procedure sponge and instrument counts were correct.

## 2021-07-06 NOTE — Anesthesia Procedure Notes (Signed)
Procedure Name: Intubation Date/Time: 07/06/2021 9:43 AM Performed by: Jaye Beagle, CRNA Pre-anesthesia Checklist: Patient identified, Emergency Drugs available, Suction available and Patient being monitored Patient Re-evaluated:Patient Re-evaluated prior to induction Oxygen Delivery Method: Circle system utilized Preoxygenation: Pre-oxygenation with 100% oxygen Induction Type: IV induction Ventilation: Mask ventilation without difficulty Laryngoscope Size: McGraph and 3 Grade View: Grade I Tube type: Oral Tube size: 7.0 mm Number of attempts: 1 Airway Equipment and Method: Stylet and Oral airway Placement Confirmation: ETT inserted through vocal cords under direct vision, positive ETCO2 and breath sounds checked- equal and bilateral Secured at: 22 cm Tube secured with: Tape Dental Injury: Teeth and Oropharynx as per pre-operative assessment

## 2021-07-06 NOTE — Anesthesia Preprocedure Evaluation (Signed)
Anesthesia Evaluation  Patient identified by MRN, date of birth, ID band Patient awake    Reviewed: Allergy & Precautions, H&P , NPO status , Patient's Chart, lab work & pertinent test results, reviewed documented beta blocker date and time   Airway Mallampati: II  TM Distance: >3 FB Neck ROM: full    Dental  (+) Teeth Intact   Pulmonary neg pulmonary ROS,    Pulmonary exam normal        Cardiovascular Exercise Tolerance: Poor hypertension, On Medications negative cardio ROS Normal cardiovascular exam Rhythm:regular Rate:Normal     Neuro/Psych  Neuromuscular disease negative psych ROS   GI/Hepatic negative GI ROS, Neg liver ROS,   Endo/Other  negative endocrine ROS  Renal/GU negative Renal ROS  negative genitourinary   Musculoskeletal   Abdominal   Peds  Hematology negative hematology ROS (+)   Anesthesia Other Findings Past Medical History: No date: Arthritis No date: Ear infection No date: Hypertension No date: Thyroid disease Past Surgical History: No date: BREAST EXCISIONAL BIOPSY; Left     Comment:  about 5 years ago she thinks - benign 02/22/2011: BREAST SURGERY     Comment:  atypical ductyl hyperplasia of left breast 03/05/2016: CHOLECYSTECTOMY; N/A     Comment:  Procedure: LAPAROSCOPIC CHOLECYSTECTOMY WITH               INTRAOPERATIVE CHOLANGIOGRAM;  Surgeon: Lattie Haw,              MD;  Location: [facility] ORS;  Service: General;  Laterality:               N/A; 2010: JOINT REPLACEMENT; Right No date: KNEE SURGERY     Comment:  right BMI    Body Mass Index: 30.18 kg/m     Reproductive/Obstetrics negative OB ROS                             Anesthesia Physical Anesthesia Plan  ASA: 2  Anesthesia Plan: General ETT   Post-op Pain Management:    Induction:   PONV Risk Score and Plan:   Airway Management Planned:   Additional Equipment:   Intra-op Plan:    Post-operative Plan:   Informed Consent: I have reviewed the patients History and Physical, chart, labs and discussed the procedure including the risks, benefits and alternatives for the proposed anesthesia with the patient or authorized representative who has indicated his/her understanding and acceptance.     Dental Advisory Given  Plan Discussed with: CRNA  Anesthesia Plan Comments:         Anesthesia Quick Evaluation

## 2021-07-06 NOTE — Interval H&P Note (Signed)
History and Physical Interval Note:  07/06/2021 9:08 AM  Julia Diaz  has presented today for surgery, with the diagnosis of K43.2 Incisional hernia w/o obstruction or gangrene.  The various methods of treatment have been discussed with the patient and family. After consideration of risks, benefits and other options for treatment, the patient has consented to  Procedure(s): XI ROBOTIC ASSISTED VENTRAL HERNIA (Incisional) (N/A) as a surgical intervention.  The patient's history has been reviewed, patient examined, no change in status, stable for surgery.  I have reviewed the patient's chart and labs.  Questions were answered to the patient's satisfaction.     Carolan Shiver

## 2021-07-06 NOTE — Transfer of Care (Signed)
Immediate Anesthesia Transfer of Care Note  Patient: Julia Diaz  Procedure(s) Performed: XI ROBOTIC ASSISTED VENTRAL HERNIA (Incisional) (Abdomen)  Patient Location: PACU  Anesthesia Type:General  Level of Consciousness: drowsy  Airway & Oxygen Therapy: Patient Spontanous Breathing and Patient connected to face mask oxygen  Post-op Assessment: Report given to RN  Post vital signs: stable  Last Vitals:  Vitals Value Taken Time  BP 104/71 07/06/21 1101  Temp    Pulse 86 07/06/21 1103  Resp 18 07/06/21 1103  SpO2 100 % 07/06/21 1103  Vitals shown include unvalidated device data.  Last Pain:  Vitals:   07/06/21 0742  TempSrc: Temporal  PainSc: 0-No pain      Patients Stated Pain Goal: 0 (07/06/21 0742)  Complications: No notable events documented.

## 2021-07-08 NOTE — Anesthesia Postprocedure Evaluation (Signed)
Anesthesia Post Note  Patient: Julia Diaz  Procedure(s) Performed: XI ROBOTIC ASSISTED VENTRAL HERNIA (Incisional) (Abdomen)  Patient location during evaluation: PACU Anesthesia Type: General Level of consciousness: awake and alert Pain management: pain level controlled Vital Signs Assessment: post-procedure vital signs reviewed and stable Respiratory status: spontaneous breathing, nonlabored ventilation, respiratory function stable and patient connected to nasal cannula oxygen Cardiovascular status: blood pressure returned to baseline and stable Postop Assessment: no apparent nausea or vomiting Anesthetic complications: no   No notable events documented.   Last Vitals:  Vitals:   07/06/21 1200 07/06/21 1243  BP: 102/65 100/60  Pulse: 72 70  Resp: 15 16  Temp: (!) 36.2 C 36.6 C  SpO2: 96% 98%    Last Pain:  Vitals:   07/06/21 1243  TempSrc:   PainSc: 3                  Yevette Edwards

## 2021-07-09 ENCOUNTER — Encounter: Payer: Self-pay | Admitting: General Surgery

## 2021-09-11 ENCOUNTER — Ambulatory Visit: Payer: BC Managed Care – PPO | Admitting: Obstetrics and Gynecology

## 2021-09-11 ENCOUNTER — Other Ambulatory Visit (HOSPITAL_COMMUNITY)
Admission: RE | Admit: 2021-09-11 | Discharge: 2021-09-11 | Disposition: A | Discharge status: Home / Self Care | Payer: BC Managed Care – PPO | Source: Ambulatory Visit | Attending: Obstetrics and Gynecology | Admitting: Obstetrics and Gynecology

## 2021-09-11 ENCOUNTER — Other Ambulatory Visit: Payer: Self-pay

## 2021-09-11 ENCOUNTER — Encounter: Payer: Self-pay | Admitting: Obstetrics and Gynecology

## 2021-09-11 VITALS — BP 138/80 | Ht 62.5 in | Wt 192.0 lb

## 2021-09-11 DIAGNOSIS — Z01419 Encounter for gynecological examination (general) (routine) without abnormal findings: Secondary | ICD-10-CM | POA: Diagnosis not present

## 2021-09-11 DIAGNOSIS — Z124 Encounter for screening for malignant neoplasm of cervix: Secondary | ICD-10-CM | POA: Diagnosis not present

## 2021-09-11 NOTE — Progress Notes (Signed)
61 y.o. G2P2 Married Hispanic female here for annual exam.   ?Returning patient of mine.  ? ?Difficulty holding her urine when she arrives home.  ?Difficulty getting to bathroom at night.  ? ?3 coffees per day.  ? ?Saw Dr. Karis Juba, urogyn at [facility] for urinary issues.  ?Did not do pelvic floor therapy. ? ?Menses stopped around age 54.  ? ?Gained weight.  ? ?Retired 2 years ago.  ?Spending time at church.  ?Enjoying exercise.  ?Married for 42 years.  ?Has a granddaughter.  ? ?PCP:   Yves Dill, MD ? ?No LMP recorded. Patient is postmenopausal.     ?  ?    ?Sexually active: Yes.    ?The current method of family planning is post menopausal status.    ?Exercising: No.  The patient does not participate in regular exercise at present. ?Smoker:  no ? ?Health Maintenance: ?Pap:  10-24-16 Neg:Neg HR HPV ?History of abnormal Pap:  no ?MMG:  04-12-21 Neg/Birads1 ?Colonoscopy:  2020 polyps; follow up in 2025.  ?BMD:   2022  Result :Normal ?TDaP:  PCP ?Gardasil:   NA ?HIV: no ?Hep C:no ?Screening Labs:  PCP ? ? reports that she has never smoked. She has never used smokeless tobacco. She reports current alcohol use. She reports that she does not use drugs. ? ?Past Medical History:  ?Diagnosis Date  ? Arthritis   ? Ear infection   ? Hypertension   ? Thyroid disease   ? ? ?Past Surgical History:  ?Procedure Laterality Date  ? BREAST EXCISIONAL BIOPSY Left   ? about 5 years ago she thinks - benign  ? BREAST SURGERY  02/22/2011  ? atypical ductyl hyperplasia of left breast  ? CHOLECYSTECTOMY N/A 03/05/2016  ? Procedure: LAPAROSCOPIC CHOLECYSTECTOMY WITH INTRAOPERATIVE CHOLANGIOGRAM;  Surgeon: Lattie Haw, MD;  Location: [facility] ORS;  Service: General;  Laterality: N/A;  ? JOINT REPLACEMENT Right 2010  ? KNEE SURGERY    ? right  ? XI ROBOTIC ASSISTED VENTRAL HERNIA N/A 07/06/2021  ? Procedure: XI ROBOTIC ASSISTED VENTRAL HERNIA (Incisional);  Surgeon: Carolan Shiver, MD;  Location: [facility] ORS;  Service: General;  Laterality: N/A;   ? ? ?Current Outpatient Medications  ?Medication Sig Dispense Refill  ? clotrimazole-betamethasone (LOTRISONE) cream 2 (two) times daily.    ? cyclobenzaprine (FLEXERIL) 5 MG tablet Take 5 mg by mouth 3 (three) times daily as needed.    ? levothyroxine (SYNTHROID) 150 MCG tablet Take 150 mcg by mouth daily before breakfast.    ? losartan-hydrochlorothiazide (HYZAAR) 100-12.5 MG tablet Take 1 tablet by mouth daily.    ? omeprazole (PRILOSEC) 40 MG capsule Take 40 mg by mouth daily.    ? phentermine (ADIPEX-P) 37.5 MG tablet Take 37.5 mg by mouth daily.    ? ?No current facility-administered medications for this visit.  ? ? ?Family History  ?Problem Relation Age of Onset  ? Hypertension Mother   ? Hypertension Father   ? Diabetes Father   ? Breast cancer Cousin   ? ? ?Review of Systems  ?All other systems reviewed and are negative. ? ?Exam:   ?BP 138/80   Ht 5' 2.5" (1.588 m)   Wt 192 lb (87.1 kg)   BMI 34.56 kg/m?     ?General appearance: alert, cooperative and appears stated age ?Head: normocephalic, without obvious abnormality, atraumatic ?Neck: no adenopathy, supple, symmetrical, trachea midline and thyroid normal to inspection and palpation ?Lungs: clear to auscultation bilaterally ?Breasts: normal appearance, no masses or tenderness, No  nipple retraction or dimpling, No nipple discharge or bleeding, No axillary adenopathy ?Heart: regular rate and rhythm ?Abdomen: soft, non-tender; no masses, no organomegaly ?Extremities: extremities normal, atraumatic, no cyanosis or edema ?Skin: skin color, texture, turgor normal. No rashes or lesions ?Lymph nodes: cervical, supraclavicular, and axillary nodes normal. ?Neurologic: grossly normal ? ?Pelvic: External genitalia:  no lesions ?             No abnormal inguinal nodes palpated. ?             Urethra:  normal appearing urethra with no masses, tenderness or lesions ?             Bartholins and Skenes: normal    ?             Vagina: normal appearing vagina with  normal color and discharge, no lesions ?             Cervix: no lesions.  Cervical os is almost flush with posterior vaginal cuff.  ?             Pap taken: yes ?Bimanual Exam:  Uterus:  normal size, contour, position, consistency, mobility, non-tender ?             Adnexa: no mass, fullness, tenderness ?             Rectal exam: yes.  Confirms. ?             Anus:  normal sphincter tone, no lesions ? ?Chaperone was present for exam:  Marchelle Folks, CMA ? ?Assessment:   ?Well woman visit with gynecologic exam. ?Hx atypical ductal hyperplasia of the breast.  ?Urinary urgency.  ? ?Plan: ?Mammogram screening discussed. ?Self breast awareness reviewed. ?Pap and HR HPV as above. ?Guidelines for Calcium, Vitamin D, regular exercise program including cardiovascular and weight bearing exercise. ?We discussed reducing caffeine to help with her bladder control.  ?If urgency persists or worsens, she will return.  I did mention medication and physical therapy for bladder control.  ?Follow up annually and prn.  ? ?After visit summary provided.  ? ? ? ?

## 2021-09-11 NOTE — Patient Instructions (Signed)

## 2021-09-13 LAB — CYTOLOGY - PAP
Comment: NEGATIVE
Diagnosis: NEGATIVE
High risk HPV: NEGATIVE

## 2022-03-15 ENCOUNTER — Other Ambulatory Visit: Payer: Self-pay | Admitting: Internal Medicine

## 2022-03-15 DIAGNOSIS — Z1231 Encounter for screening mammogram for malignant neoplasm of breast: Secondary | ICD-10-CM

## 2022-04-15 ENCOUNTER — Ambulatory Visit
Admission: RE | Admit: 2022-04-15 | Discharge: 2022-04-15 | Disposition: A | Discharge status: Home / Self Care | Payer: BC Managed Care – PPO | Source: Ambulatory Visit | Attending: Internal Medicine | Admitting: Internal Medicine

## 2022-04-15 DIAGNOSIS — Z1231 Encounter for screening mammogram for malignant neoplasm of breast: Secondary | ICD-10-CM

## 2022-06-03 DIAGNOSIS — R768 Other specified abnormal immunological findings in serum: Secondary | ICD-10-CM | POA: Insufficient documentation

## 2022-07-29 ENCOUNTER — Encounter: Payer: Self-pay | Admitting: Internal Medicine

## 2022-07-29 ENCOUNTER — Ambulatory Visit (INDEPENDENT_AMBULATORY_CARE_PROVIDER_SITE_OTHER): Payer: BC Managed Care – PPO | Admitting: Internal Medicine

## 2022-07-29 VITALS — BP 134/78 | HR 86 | Ht 66.0 in | Wt 189.2 lb

## 2022-07-29 DIAGNOSIS — I10 Essential (primary) hypertension: Secondary | ICD-10-CM

## 2022-07-29 DIAGNOSIS — R1010 Upper abdominal pain, unspecified: Secondary | ICD-10-CM

## 2022-07-29 DIAGNOSIS — E782 Mixed hyperlipidemia: Secondary | ICD-10-CM | POA: Diagnosis not present

## 2022-07-29 DIAGNOSIS — N390 Urinary tract infection, site not specified: Secondary | ICD-10-CM | POA: Diagnosis not present

## 2022-07-29 DIAGNOSIS — M549 Dorsalgia, unspecified: Secondary | ICD-10-CM | POA: Diagnosis not present

## 2022-07-29 DIAGNOSIS — R319 Hematuria, unspecified: Secondary | ICD-10-CM

## 2022-07-29 DIAGNOSIS — E038 Other specified hypothyroidism: Secondary | ICD-10-CM

## 2022-07-29 LAB — POCT URINALYSIS DIP (CLINITEK)
Spec Grav, UA: 1.025 (ref 1.010–1.025)
Urobilinogen, UA: 0.2 E.U./dL
pH, UA: 6 (ref 5.0–8.0)

## 2022-07-29 MED ORDER — CIPROFLOXACIN HCL 500 MG PO TABS: 500.0000 mg | ORAL_TABLET | Freq: Two times a day (BID) | ORAL | 0 refills | Status: AC

## 2022-07-29 NOTE — Progress Notes (Signed)
Acute Office Visit  Subjective:    Back Pain and Abdominal Pain Patient ID: Julia Diaz, female    DOB: 12/31/1960, 62 y.o.   MRN: [id number]    HPI Patient is in today for a few complaints.  She mentions right-sided mid pain which started few weeks ago but now getting a little bit more prominent, and about the same time she developed upper abdominal pain also more localized towards the right side.  Patient does not have a gallbladder.  She does not have any painful or burning micturition and did not notice any blood in the urine.  Denies any fever or chills.  Denies any nausea vomiting or diarrhea.  However she does feel bloated at times and passes some gas.  Patient mentions that she does have history of hemorrhoids which bleeds sometimes, and has been referred to the gastroenterologist however she did not see them yet as they told her they have not received the referral.  Although the referral has been sent to them back in December as saved in her chart.  We will get a urine specimen.  Review of Systems  Constitutional:  Negative for chills, diaphoresis, fever, malaise/fatigue and weight loss.  HENT: Negative.    Eyes: Negative.   Respiratory: Negative.    Cardiovascular: Negative.   Gastrointestinal:  Positive for abdominal pain, blood in stool and constipation. Negative for diarrhea, heartburn, melena, nausea and vomiting.  Genitourinary:  Negative for dysuria, frequency, hematuria and urgency.  Musculoskeletal: Negative.   Skin: Negative.   Neurological: Negative.   Endo/Heme/Allergies: Negative.   Psychiatric/Behavioral: Negative.          Objective:    BP 134/78 (BP Location: Left Arm)   Pulse 86   Ht 5\' 6"  (1.676 m)   Wt 189 lb 3.2 oz (85.8 kg)   SpO2 96%   BMI 30.54 kg/m    Physical Exam Constitutional:      Appearance: Normal appearance.  HENT:     Head: Normocephalic.  Cardiovascular:     Rate and Rhythm: Normal rate and regular rhythm.     Pulses:  Normal pulses.  Abdominal:     General: Abdomen is flat. Bowel sounds are normal. There is no distension. There are no signs of injury.     Palpations: Abdomen is soft. There is no shifting dullness or mass.     Tenderness: There is no abdominal tenderness. There is no left CVA tenderness or guarding.  Musculoskeletal:     Cervical back: Normal, normal range of motion and neck supple.     Thoracic back: Tenderness present. No swelling, deformity, spasms or bony tenderness. Normal range of motion. No scoliosis.     Lumbar back: Normal.  Skin:    General: Skin is warm and dry.     Findings: No lesion or rash.  Neurological:     Mental Status: She is alert.           Assessment & Plan:   Will send urine for c/s. Meanwhile start PO Cipro.  Sch CT abd/pelvis. Send referral again to GI. Needs Fasting labs. Problem List Items Addressed This Visit     Essential hypertension   Other Visit Diagnoses     Acute right-sided back pain, unspecified back location    -  Primary   Relevant Orders   POCT URINALYSIS DIP (CLINITEK)   Mixed hyperlipidemia       Relevant Orders   CMP14+EGFR   Lipid Panel w/o Chol/HDL Ratio  Pain of upper abdomen       Relevant Orders   Lipase   CBC With Differential       No orders of the defined types were placed in this encounter.   Return in about 2 weeks (around 08/12/2022).  Perrin Maltese, MD

## 2022-07-30 ENCOUNTER — Telehealth: Payer: Self-pay

## 2022-07-30 ENCOUNTER — Other Ambulatory Visit: Payer: BC Managed Care – PPO

## 2022-07-30 NOTE — Telephone Encounter (Signed)
Pt informed

## 2022-07-30 NOTE — Telephone Encounter (Signed)
Pt came in for labs and she asked if you can send a different antibiotic for her? Said the one that was sent is too strong for her and asked about an alternate rx? please advise

## 2022-07-31 LAB — CBC WITH DIFFERENTIAL
Basophils Absolute: 0 10*3/uL (ref 0.0–0.2)
Basos: 0 %
EOS (ABSOLUTE): 0.2 10*3/uL (ref 0.0–0.4)
Eos: 3 %
Hematocrit: 42.4 % (ref 34.0–46.6)
Hemoglobin: 13.7 g/dL (ref 11.1–15.9)
Immature Grans (Abs): 0 10*3/uL (ref 0.0–0.1)
Immature Granulocytes: 0 %
Lymphocytes Absolute: 1.7 10*3/uL (ref 0.7–3.1)
Lymphs: 29 %
MCH: 30.4 pg (ref 26.6–33.0)
MCHC: 32.3 g/dL (ref 31.5–35.7)
MCV: 94 fL (ref 79–97)
Monocytes Absolute: 0.4 10*3/uL (ref 0.1–0.9)
Monocytes: 6 %
Neutrophils Absolute: 3.6 10*3/uL (ref 1.4–7.0)
Neutrophils: 62 %
RBC: 4.51 x10E6/uL (ref 3.77–5.28)
RDW: 12 % (ref 11.7–15.4)
WBC: 5.9 10*3/uL (ref 3.4–10.8)

## 2022-07-31 LAB — CMP14+EGFR
ALT: 18 IU/L (ref 0–32)
AST: 18 IU/L (ref 0–40)
Albumin/Globulin Ratio: 1.4 (ref 1.2–2.2)
Albumin: 3.9 g/dL (ref 3.9–4.9)
Alkaline Phosphatase: 77 IU/L (ref 44–121)
BUN/Creatinine Ratio: 24 (ref 12–28)
BUN: 22 mg/dL (ref 8–27)
Bilirubin Total: 0.4 mg/dL (ref 0.0–1.2)
CO2: 25 mmol/L (ref 20–29)
Calcium: 9.2 mg/dL (ref 8.7–10.3)
Chloride: 105 mmol/L (ref 96–106)
Creatinine, Ser: 0.9 mg/dL (ref 0.57–1.00)
Globulin, Total: 2.8 g/dL (ref 1.5–4.5)
Glucose: 94 mg/dL (ref 70–99)
Potassium: 4.4 mmol/L (ref 3.5–5.2)
Sodium: 143 mmol/L (ref 134–144)
Total Protein: 6.7 g/dL (ref 6.0–8.5)
eGFR: 72 mL/min/{1.73_m2} (ref >59)

## 2022-07-31 LAB — LIPID PANEL W/O CHOL/HDL RATIO
Cholesterol, Total: 171 mg/dL (ref 100–199)
HDL: 49 mg/dL (ref >39)
LDL Chol Calc (NIH): 110 mg/dL — ABNORMAL HIGH (ref 0–99)
Triglycerides: 61 mg/dL (ref 0–149)
VLDL Cholesterol Cal: 12 mg/dL (ref 5–40)

## 2022-07-31 LAB — LIPASE: Lipase: 23 U/L (ref 14–72)

## 2022-07-31 LAB — TSH+T4F+T3FREE
Free T4: 1.6 ng/dL (ref 0.82–1.77)
T3, Free: 2.6 pg/mL (ref 2.0–4.4)
TSH: 1.78 u[IU]/mL (ref 0.450–4.500)

## 2022-08-01 LAB — URINE CULTURE: Organism ID, Bacteria: NO GROWTH

## 2022-08-05 ENCOUNTER — Ambulatory Visit (INDEPENDENT_AMBULATORY_CARE_PROVIDER_SITE_OTHER): Payer: BC Managed Care – PPO

## 2022-08-05 DIAGNOSIS — R1010 Upper abdominal pain, unspecified: Secondary | ICD-10-CM

## 2022-08-05 MED ORDER — IOPAMIDOL (ISOVUE-300) INJECTION 61%
100.0000 mL | Freq: Once | INTRAVENOUS | Status: AC | PRN
  Administered 2022-08-05: 100 mL via INTRAVENOUS

## 2022-08-12 ENCOUNTER — Ambulatory Visit: Payer: BC Managed Care – PPO | Admitting: Internal Medicine

## 2022-08-15 ENCOUNTER — Encounter: Payer: Self-pay | Admitting: Internal Medicine

## 2022-08-15 ENCOUNTER — Ambulatory Visit (INDEPENDENT_AMBULATORY_CARE_PROVIDER_SITE_OTHER): Payer: BC Managed Care – PPO | Admitting: Internal Medicine

## 2022-08-15 VITALS — BP 132/78 | HR 104 | Ht 66.0 in | Wt 190.4 lb

## 2022-08-15 DIAGNOSIS — Z20822 Contact with and (suspected) exposure to covid-19: Secondary | ICD-10-CM

## 2022-08-15 DIAGNOSIS — I10 Essential (primary) hypertension: Secondary | ICD-10-CM | POA: Diagnosis not present

## 2022-08-15 DIAGNOSIS — J069 Acute upper respiratory infection, unspecified: Secondary | ICD-10-CM | POA: Insufficient documentation

## 2022-08-15 DIAGNOSIS — E038 Other specified hypothyroidism: Secondary | ICD-10-CM | POA: Diagnosis not present

## 2022-08-15 DIAGNOSIS — K219 Gastro-esophageal reflux disease without esophagitis: Secondary | ICD-10-CM

## 2022-08-15 LAB — POCT XPERT XPRESS SARS COVID-2/FLU/RSV
FLU A: NEGATIVE
FLU B: NEGATIVE
RSV RNA, PCR: NEGATIVE
SARS Coronavirus 2: NEGATIVE

## 2022-08-15 NOTE — Progress Notes (Signed)
Established Patient Office Visit  Subjective:  Patient ID: Julia Diaz, female    DOB: [birth year]  Age: 62 y.o. MRN: [id number]  Chief Complaint  Patient presents with   Follow-up    2 week follow up    Patient comes in for follow-up today.  She says that she is actually feeling better and does not have any more right-sided pain or the right flank pain.  At her last visit her urine dipstick in the office had shown a few pus cells and she was started empirically on Cipro, however it was sent to the lab, the results showed no growth of any bacteria.  Patient had her blood work done which was initially essentially unremarkable.  As well as the CT scan of her abdomen and pelvis. Today patient complains of an upper respiratory tract infection which started yesterday.  She has been having some chills , some body aches, but no fever.  She is feeling tired and has a postnasal drip with some cough and sore throat.   Her COVID, Flu and RSV tests  in the office are all negative.     Past Medical History:  Diagnosis Date   Arthritis    Ear infection    Hypertension    Thyroid disease     Social History   Socioeconomic History   Marital status: Married    Spouse name: Not on file   Number of children: Not on file   Years of education: Not on file   Highest education level: Not on file  Occupational History   Not on file  Tobacco Use   Smoking status: Never   Smokeless tobacco: Never  Vaping Use   Vaping Use: Never used  Substance and Sexual Activity   Alcohol use: Yes    Comment: Few times a year   Drug use: No   Sexual activity: Yes    Birth control/protection: Post-menopausal  Other Topics Concern   Not on file  Social History Narrative   Not on file   Social Determinants of Health   Financial Resource Strain: Not on file  Food Insecurity: Not on file  Transportation Needs: Not on file  Physical Activity: Not on file  Stress: Not on file  Social Connections:  Not on file  Intimate Partner Violence: Not on file    Family History  Problem Relation Age of Onset   Hypertension Mother    Hypertension Father    Diabetes Father    Breast cancer Cousin     Allergies  Allergen Reactions   Aspirin Swelling    Face, eyes. Patient does not remember if breathing was affected.  Face, eyes. Patient does not remember if breathing was affected. Face, eyes. Patient does not remember if breathing was affected. Face, eyes. Patient does not remember if breathing was affected.    Ace Inhibitors Cough    Note: cough   Amlodipine Other (See Comments)    Note: headaches    Review of Systems  Constitutional:  Positive for chills and malaise/fatigue. Negative for fever and weight loss.  HENT:  Positive for congestion, sinus pain and sore throat. Negative for ear discharge, ear pain, hearing loss and nosebleeds.   Eyes:  Negative for blurred vision and double vision.  Respiratory:  Positive for cough. Negative for hemoptysis and sputum production.   Cardiovascular:  Negative for chest pain, palpitations and orthopnea.  Gastrointestinal:  Negative for abdominal pain, heartburn, nausea and vomiting.  Genitourinary:  Negative for dysuria,  frequency and urgency.  Musculoskeletal:  Positive for myalgias.  Neurological:  Positive for headaches. Negative for dizziness and sensory change.  Psychiatric/Behavioral:  Negative for depression. The patient is not nervous/anxious.        Objective:   BP 132/78   Pulse (!) 104   Ht 5' 6"$  (1.676 m)   Wt 190 lb 6.4 oz (86.4 kg)   SpO2 96%   BMI 30.73 kg/m   Vitals:   08/15/22 0949  BP: 132/78  Pulse: (!) 104  Height: 5' 6"$  (1.676 m)  Weight: 190 lb 6.4 oz (86.4 kg)  SpO2: 96%  BMI (Calculated): 30.75    Physical Exam Vitals and nursing note reviewed.  Constitutional:      Appearance: Normal appearance.  HENT:     Right Ear: Tympanic membrane normal.     Left Ear: Tympanic membrane normal.     Nose:  Congestion present.  Cardiovascular:     Rate and Rhythm: Normal rate and regular rhythm.  Pulmonary:     Effort: Pulmonary effort is normal.     Breath sounds: Normal breath sounds.  Abdominal:     General: Abdomen is flat. Bowel sounds are normal.     Palpations: Abdomen is soft.  Musculoskeletal:        General: Normal range of motion.     Cervical back: Normal range of motion.  Skin:    General: Skin is warm.  Neurological:     General: No focal deficit present.     Mental Status: She is alert and oriented to person, place, and time.  Psychiatric:        Mood and Affect: Mood normal.        Behavior: Behavior normal.      Results for orders placed or performed in visit on 08/15/22  POCT XPERT XPRESS SARS COVID-2/FLU/RSV [id number]  Result Value Ref Range   SARS Coronavirus 2 Negative    FLU A Negative    FLU B Negative    RSV RNA, PCR Negative     Recent Results (from the past 2160 hour(s))  POCT URINALYSIS DIP (CLINITEK)     Status: Abnormal   Collection Time: 07/29/22  2:31 PM  Result Value Ref Range   Color, UA     Clarity, UA     Glucose, UA     Bilirubin, UA     Ketones, POC UA     Spec Grav, UA 1.025 1.010 - 1.025   Blood, UA small (A) negative   pH, UA 6.0 5.0 - 8.0   POC PROTEIN,UA     Urobilinogen, UA 0.2 0.2 or 1.0 E.U./dL   Nitrite, UA     Leukocytes, UA    Urine Culture     Status: None   Collection Time: 07/29/22  3:38 PM   Specimen: Urine   UC  Result Value Ref Range   Urine Culture, Routine Final report    Organism ID, Bacteria No growth   Lipase     Status: None   Collection Time: 07/30/22  8:35 AM  Result Value Ref Range   Lipase 23 14 - 72 U/L  CMP14+EGFR     Status: None   Collection Time: 07/30/22  8:35 AM  Result Value Ref Range   Glucose 94 70 - 99 mg/dL   BUN 22 8 - 27 mg/dL   Creatinine, Ser 0.90 0.57 - 1.00 mg/dL   eGFR 72 >59 mL/min/1.73   BUN/Creatinine Ratio 24 12 - 28  Sodium 143 134 - 144 mmol/L   Potassium 4.4  3.5 - 5.2 mmol/L   Chloride 105 96 - 106 mmol/L   CO2 25 20 - 29 mmol/L   Calcium 9.2 8.7 - 10.3 mg/dL   Total Protein 6.7 6.0 - 8.5 g/dL   Albumin 3.9 3.9 - 4.9 g/dL   Globulin, Total 2.8 1.5 - 4.5 g/dL   Albumin/Globulin Ratio 1.4 1.2 - 2.2   Bilirubin Total 0.4 0.0 - 1.2 mg/dL   Alkaline Phosphatase 77 44 - 121 IU/L   AST 18 0 - 40 IU/L   ALT 18 0 - 32 IU/L  CBC With Differential     Status: None   Collection Time: 07/30/22  8:35 AM  Result Value Ref Range   WBC 5.9 3.4 - 10.8 x10E3/uL   RBC 4.51 3.77 - 5.28 x10E6/uL   Hemoglobin 13.7 11.1 - 15.9 g/dL   Hematocrit 42.4 34.0 - 46.6 %   MCV 94 79 - 97 fL   MCH 30.4 26.6 - 33.0 pg   MCHC 32.3 31.5 - 35.7 g/dL   RDW 12.0 11.7 - 15.4 %   Neutrophils 62 Not Estab. %   Lymphs 29 Not Estab. %   Monocytes 6 Not Estab. %   Eos 3 Not Estab. %   Basos 0 Not Estab. %   Neutrophils Absolute 3.6 1.4 - 7.0 x10E3/uL   Lymphocytes Absolute 1.7 0.7 - 3.1 x10E3/uL   Monocytes Absolute 0.4 0.1 - 0.9 x10E3/uL   EOS (ABSOLUTE) 0.2 0.0 - 0.4 x10E3/uL   Basophils Absolute 0.0 0.0 - 0.2 x10E3/uL   Immature Granulocytes 0 Not Estab. %   Immature Grans (Abs) 0.0 0.0 - 0.1 x10E3/uL  Lipid Panel w/o Chol/HDL Ratio     Status: Abnormal   Collection Time: 07/30/22  8:35 AM  Result Value Ref Range   Cholesterol, Total 171 100 - 199 mg/dL   Triglycerides 61 0 - 149 mg/dL   HDL 49 >39 mg/dL   VLDL Cholesterol Cal 12 5 - 40 mg/dL   LDL Chol Calc (NIH) 110 (H) 0 - 99 mg/dL  TSH+T4F+T3Free     Status: None   Collection Time: 07/30/22  8:35 AM  Result Value Ref Range   TSH 1.780 0.450 - 4.500 uIU/mL   T3, Free 2.6 2.0 - 4.4 pg/mL   Free T4 1.60 0.82 - 1.77 ng/dL  POCT XPERT XPRESS SARS COVID-2/FLU/RSV [id number]     Status: Normal   Collection Time: 08/15/22 11:41 AM  Result Value Ref Range   SARS Coronavirus 2 Negative    FLU A Negative    FLU B Negative    RSV RNA, PCR Negative       Assessment & Plan:  Jenafer was seen today for  follow-up.  Diagnoses and all orders for this visit:  Upper respiratory tract infection, unspecified type -     POCT XPERT XPRESS SARS COVID-2/FLU/RSV [id number]  Essential hypertension  Other specified hypothyroidism  Gastroesophageal reflux disease without esophagitis    Patient advised rest, fluids , and can take Tylenol.   Return in about 4 months (around 12/14/2022).   Total time spent: 30 minutes  Perrin Maltese, MD  08/15/2022

## 2022-09-16 NOTE — Progress Notes (Unsigned)
62 y.o. G2P2 Married other female here for annual exam.  Pt is noticing urinary incontinence and never took the medication she was prescribed last time.  Urgency with incontinence at night.  DF - every 30 minutes. NF - up to 3 times. No urinary incontinence during the day unless she is arriving home, has key in lock. Her urinary control is worsening.   Recent UTI.  Some suprapubic discomfort.   Some leak with cough and laugh.  She was seen by Dr. Ricki Miller at [facility] in past for hematuria.  Patient states her evaluation was normal.   Has constipation and hemorrhoids. Will see GI next week.   PCP:   Yves Dill, MD  No LMP recorded. Patient is postmenopausal.           Sexually active: Yes.    The current method of family planning is post menopausal status.    Exercising: Yes.     walking Smoker:  no  Health Maintenance: Pap:  09/11/21 neg: HR HPV neg, 10/24/16 neg: HR HPV neg History of abnormal Pap:  no MMG:  04/15/22 Breast Density Category C, BI-RADS CAT 1 NEG Colonoscopy:  2020 polyps, f/u 2025 BMD:   2022  Result  normal TDaP:  PCP Gardasil:   no HIV: n/a Hep C: n/a Screening Labs:  PCP   reports that she has never smoked. She has never used smokeless tobacco. She reports current alcohol use. She reports that she does not use drugs.  Past Medical History:  Diagnosis Date   Arthritis    Ear infection    Hypertension    Thyroid disease     Past Surgical History:  Procedure Laterality Date   BREAST EXCISIONAL BIOPSY Left    about 5 years ago she thinks - benign   BREAST SURGERY  02/22/2011   atypical ductyl hyperplasia of left breast   CHOLECYSTECTOMY N/A 03/05/2016   Procedure: LAPAROSCOPIC CHOLECYSTECTOMY WITH INTRAOPERATIVE CHOLANGIOGRAM;  Surgeon: Lattie Haw, MD;  Location: [facility] ORS;  Service: General;  Laterality: N/A;   JOINT REPLACEMENT Right 2010   KNEE SURGERY     right   XI ROBOTIC ASSISTED VENTRAL HERNIA N/A 07/06/2021   Procedure: XI ROBOTIC  ASSISTED VENTRAL HERNIA (Incisional);  Surgeon: Carolan Shiver, MD;  Location: [facility] ORS;  Service: General;  Laterality: N/A;    Current Outpatient Medications  Medication Sig Dispense Refill   clotrimazole-betamethasone (LOTRISONE) cream 2 (two) times daily.     cyclobenzaprine (FLEXERIL) 5 MG tablet Take 5 mg by mouth 3 (three) times daily as needed.     diclofenac Sodium (VOLTAREN) 1 % GEL APPLY 4 GRAM TO THE AFFECTED AREA(S) left ankle BY TOPICAL ROUTE 4 TIMES PER DAY     levothyroxine (SYNTHROID) 150 MCG tablet Take 150 mcg by mouth daily before breakfast.     losartan-hydrochlorothiazide (HYZAAR) 100-12.5 MG tablet Take 1 tablet by mouth daily.     omeprazole (PRILOSEC) 40 MG capsule Take 40 mg by mouth daily.     phentermine (ADIPEX-P) 37.5 MG tablet Take 37.5 mg by mouth daily.     amLODipine (NORVASC) 5 MG tablet      No current facility-administered medications for this visit.    Family History  Problem Relation Age of Onset   Hypertension Mother    Hypertension Father    Diabetes Father    Breast cancer Cousin     Review of Systems  All other systems reviewed and are negative.   Exam:   BP 128/84 (  BP Location: Left Arm, Patient Position: Sitting, Cuff Size: Normal)   Ht 5' 3.5" (1.613 m)   Wt 193 lb (87.5 kg)   BMI 33.65 kg/m     General appearance: alert, cooperative and appears stated age Head: normocephalic, without obvious abnormality, atraumatic Neck: no adenopathy, supple, symmetrical, trachea midline and thyroid normal to inspection and palpation Lungs: clear to auscultation bilaterally Breasts: normal appearance, no masses or tenderness, No nipple retraction or dimpling, No nipple discharge or bleeding, No axillary adenopathy Heart: regular rate and rhythm Abdomen: soft, non-tender; no masses, no organomegaly Extremities: extremities normal, atraumatic, no cyanosis or edema Skin: skin color, texture, turgor normal. No rashes or lesions Lymph  nodes: cervical, supraclavicular, and axillary nodes normal. Neurologic: grossly normal  Pelvic: External genitalia:  no lesions              No abnormal inguinal nodes palpated.              Urethra:  normal appearing urethra with no masses, tenderness or lesions              Bartholins and Skenes: normal                 Vagina: normal appearing vagina with normal color and discharge, no lesions              Cervix: no lesions              Pap taken: no Bimanual Exam:  Uterus:  normal size, contour, position, consistency, mobility, non-tender              Adnexa: no mass, fullness, tenderness              Rectal exam: yes.  Confirms.              Anus:  normal sphincter tone, hemorrhoid noted.  Chaperone was present for exam:  Warren Lacymily F, CMA  Assessment:   Well woman visit with gynecologic exam. Hx atypical ductal hyperplasia of the breast.  Vaginitis. Mixed incontinence.   Plan: Mammogram screening discussed. Self breast awareness reviewed. Pap and HR HPV as above. Guidelines for Calcium, Vitamin D, regular exercise program including cardiovascular and weight bearing exercise. Vaginitis swab collected.  We discussed vaginal vitamin E for atrophy symptoms.  Urinalysis:  sg 1.025, ph 5.5, 0 - 5 WBC, 3 - 10 RBC, 0 - 5 squams, few bacteria, moderate mucus. Reflex urine cx sent.  Bladder irritants discussed.  She declines medication for overactive bladder.  Will make a referral for pelvic floor therapy. Follow up annually and prn.   After visit summary provided.   In addition to the annual exam, the patient was seen and evaluated for mixed incontinence and vaginitis.

## 2022-09-20 ENCOUNTER — Encounter: Payer: BC Managed Care – PPO | Admitting: Obstetrics and Gynecology

## 2022-09-30 ENCOUNTER — Telehealth: Payer: Self-pay | Admitting: Obstetrics and Gynecology

## 2022-09-30 ENCOUNTER — Encounter: Payer: Self-pay | Admitting: Obstetrics and Gynecology

## 2022-09-30 ENCOUNTER — Other Ambulatory Visit (HOSPITAL_COMMUNITY)
Admission: RE | Admit: 2022-09-30 | Discharge: 2022-09-30 | Disposition: A | Discharge status: Home / Self Care | Payer: BC Managed Care – PPO | Source: Ambulatory Visit | Attending: Obstetrics and Gynecology | Admitting: Obstetrics and Gynecology

## 2022-09-30 ENCOUNTER — Ambulatory Visit (INDEPENDENT_AMBULATORY_CARE_PROVIDER_SITE_OTHER): Payer: BC Managed Care – PPO | Admitting: Obstetrics and Gynecology

## 2022-09-30 VITALS — BP 128/84 | Ht 63.5 in | Wt 193.0 lb

## 2022-09-30 DIAGNOSIS — Z01419 Encounter for gynecological examination (general) (routine) without abnormal findings: Secondary | ICD-10-CM

## 2022-09-30 DIAGNOSIS — N760 Acute vaginitis: Secondary | ICD-10-CM

## 2022-09-30 DIAGNOSIS — N3946 Mixed incontinence: Secondary | ICD-10-CM | POA: Diagnosis not present

## 2022-09-30 LAB — CULTURE INDICATED

## 2022-09-30 NOTE — Telephone Encounter (Signed)
Amb referral order placed. Routed to [facility] to follow referral.

## 2022-09-30 NOTE — Patient Instructions (Addendum)
EXERCISE AND DIET:  We recommended that you start or continue a regular exercise program for good health. Regular exercise means any activity that makes your heart beat faster and makes you sweat.  We recommend exercising at least 30 minutes per day at least 3 days a week, preferably 4 or 5.  We also recommend a diet low in fat and sugar.  Inactivity, poor dietary choices and obesity can cause diabetes, heart attack, stroke, and kidney damage, among others.    ALCOHOL AND SMOKING:  Women should limit their alcohol intake to no more than 7 drinks/beers/glasses of wine (combined, not each!) per week. Moderation of alcohol intake to this level decreases your risk of breast cancer and liver damage. And of course, no recreational drugs are part of a healthy lifestyle.  And absolutely no smoking or even second hand smoke. Most people know smoking can cause heart and lung diseases, but did you know it also contributes to weakening of your bones? Aging of your skin?  Yellowing of your teeth and nails?  CALCIUM AND VITAMIN D:  Adequate intake of calcium and Vitamin D are recommended.  The recommendations for exact amounts of these supplements seem to change often, but generally speaking 600 mg of calcium (either carbonate or citrate) and 800 units of Vitamin D per day seems prudent. Certain women may benefit from higher intake of Vitamin D.  If you are among these women, your doctor will have told you during your visit.    PAP SMEARS:  Pap smears, to check for cervical cancer or precancers,  have traditionally been done yearly, although recent scientific advances have shown that most women can have pap smears less often.  However, every woman still should have a physical exam from her gynecologist every year. It will include a breast check, inspection of the vulva and vagina to check for abnormal growths or skin changes, a visual exam of the cervix, and then an exam to evaluate the size and shape of the uterus and  ovaries.  And after 64 years of age, a rectal exam is indicated to check for rectal cancers. We will also provide age appropriate advice regarding health maintenance, like when you should have certain vaccines, screening for sexually transmitted diseases, bone density testing, colonoscopy, mammograms, etc.   MAMMOGRAMS:  All women over 40 years old should have a yearly mammogram. Many facilities now offer a "3D" mammogram, which may cost around $50 extra out of pocket. If possible,  we recommend you accept the option to have the 3D mammogram performed.  It both reduces the number of women who will be called back for extra views which then turn out to be normal, and it is better than the routine mammogram at detecting truly abnormal areas.    COLONOSCOPY:  Colonoscopy to screen for colon cancer is recommended for all women at age 50.  We know, you hate the idea of the prep.  We agree, BUT, having colon cancer and not knowing it is worse!!  Colon cancer so often starts as a polyp that can be seen and removed at colonscopy, which can quite literally save your life!  And if your first colonoscopy is normal and you have no family history of colon cancer, most women don't have to have it again for 10 years.  Once every ten years, you can do something that may end up saving your life, right?  We will be happy to help you get it scheduled when you are ready.    Be sure to check your insurance coverage so you understand how much it will cost.  It may be covered as a preventative service at no cost, but you should check your particular policy.    Urinary Incontinence Urinary incontinence refers to a condition in which a person is unable to control where and when to pass urine. A person with this condition will urinate involuntarily. This means that the person urinates when he or she does not mean to. What are the causes? This condition may be caused by: Medicines. Infections. Constipation. Overactive bladder  muscles. Weak bladder muscles. Weak pelvic floor muscles. These muscles provide support for the bladder, intestine, and, in women, the uterus. Enlarged prostate in men. The prostate is a gland near the bladder. When it gets too big, it can pinch the urethra. With the urethra blocked, the bladder can weaken and lose the ability to empty properly. Surgery. Emotional factors, such as anxiety, stress, or post-traumatic stress disorder (PTSD). Spinal cord injury, nerve injury, or other neurological conditions. Pelvic organ prolapse. This happens in women when organs move out of place and into the vagina. This movement can prevent the bladder and urethra from working properly. What increases the risk? The following factors may make you more likely to develop this condition: Age. The older you are, the higher the risk. Obesity. Being physically inactive. Pregnancy and childbirth. Menopause. Diseases that affect the nerves or spinal cord. Long-term, or chronic, coughing. This can increase pressure on the bladder and pelvic floor muscles. What are the signs or symptoms? Symptoms may vary depending on the type of urinary incontinence you have. They include: A sudden urge to urinate, and passing urine involuntarily before you can get to a bathroom (urge incontinence). Suddenly passing urine when doing activities that force urine to pass, such as coughing, laughing, exercising, or sneezing (stress incontinence). Needing to urinate often but urinating only a small amount, or constantly dribbling urine (overflow incontinence). Urinating because you cannot get to the bathroom in time due to a physical disability, such as arthritis or injury, or due to a communication or thinking problem, such as Alzheimer's disease (functional incontinence). How is this diagnosed? This condition may be diagnosed based on: Your medical history. A physical exam. Tests, such as: Urine tests. X-rays of your kidney and  bladder. Ultrasound. CT scan. Cystoscopy. In this procedure, a health care provider inserts a tube with a light and camera (cystoscope) through the urethra and into the bladder to check for problems. Urodynamic testing. These tests assess how well the bladder, urethra, and sphincter can store and release urine. There are different types of urodynamic tests, and they vary depending on what the test is measuring. To help diagnose your condition, your health care provider may recommend that you keep a log of when you urinate and how much you urinate. How is this treated? Treatment for this condition depends on the type of incontinence that you have and its cause. Treatment may include: Lifestyle changes, such as: Quitting smoking. Maintaining a healthy weight. Staying active. Try to get 150 minutes of moderate-intensity exercise every week. Ask your health care provider which activities are safe for you. Eating a healthy diet. Avoid high-fat foods, like fried foods. Avoid refined carbohydrates like white bread and white rice. Limit how much alcohol and caffeine you drink. Increase your fiber intake. Healthy sources of fiber include beans, whole grains, and fresh fruits and vegetables. Behavioral changes, such as: Pelvic floor muscle exercises. Bladder training, such as lengthening  the amount of time between bathroom breaks, or using the bathroom at regular intervals. Using techniques to suppress bladder urges. This can include distraction techniques or controlled breathing exercises. Medicines, such as: Medicines to relax the bladder muscles and prevent bladder spasms. Medicines to help slow or prevent the growth of a man's prostate. Botox injections. These can help relax the bladder muscles. Treatments, such as: Using pulses of electricity to help change bladder reflexes (electrical nerve stimulation). For women, using a medical device to prevent urine leaks. This is a small, tampon-like,  disposable device that is inserted into the urethra. Injecting collagen or carbon beads (bulking agents) into the urinary sphincter. These can help thicken tissue and close the bladder opening. Surgery. Follow these instructions at home: Lifestyle Limit alcohol and caffeine. These can fill your bladder quickly and irritate it. Keep yourself clean to help prevent odors and skin damage. Ask your health care provider about special skin creams and cleansers that can protect the skin from urine. Consider wearing pads or adult diapers. Make sure to change them regularly, and always change them right after experiencing incontinence. General instructions Take over-the-counter and prescription medicines only as told by your health care provider. Use the bathroom about every 3-4 hours, even if you do not feel the need to urinate. Try to empty your bladder completely every time. After urinating, wait a minute. Then try to urinate again. Make sure you are in a relaxed position while urinating. If your incontinence is caused by nerve problems, keep a log of the medicines you take and the times you go to the bathroom. Keep all follow-up visits. This is important. Where to find more information [employer] of Diabetes and Digestive and Kidney Diseases: CarFli[employer].tn American Urology Association: www.urologyhealth.org Contact a health care provider if: You have pain that gets worse. Your incontinence gets worse. Get help right away if: You have a fever or chills. You are unable to urinate. You have redness in your groin area or down your legs. Summary Urinary incontinence refers to a condition in which a person is unable to control where and when to pass urine. This condition may be caused by medicines, infection, weak bladder muscles, weak pelvic floor muscles, enlargement of the prostate (in men), or surgery. Factors such as older age, obesity, pregnancy and childbirth, menopause,  neurological diseases, and chronic coughing may increase your risk for developing this condition. Types of urinary incontinence include urge incontinence, stress incontinence, overflow incontinence, and functional incontinence. This condition is usually treated first with lifestyle and behavioral changes, such as quitting smoking, eating a healthier diet, and doing regular pelvic floor exercises. Other treatment options include medicines, bulking agents, medical devices, electrical nerve stimulation, or surgery. This information is not intended to replace advice given to you by your health care provider. Make sure you discuss any questions you have with your health care provider. Document Revised: 01/14/2020 Document Reviewed: 01/14/2020 Elsevier Patient Education  2023 [employer].

## 2022-09-30 NOTE — Telephone Encounter (Signed)
Please make a referral to [facility] Pelvic Floor Therapy for mixed incontinence.

## 2022-10-01 LAB — CERVICOVAGINAL ANCILLARY ONLY
Bacterial Vaginitis (gardnerella): NEGATIVE
Candida Glabrata: NEGATIVE
Candida Vaginitis: NEGATIVE
Comment: NEGATIVE
Comment: NEGATIVE
Comment: NEGATIVE
Comment: NEGATIVE
Trichomonas: NEGATIVE

## 2022-10-02 LAB — URINALYSIS, COMPLETE W/RFL CULTURE
Bilirubin Urine: NEGATIVE
Glucose, UA: NEGATIVE
Hyaline Cast: NONE SEEN /LPF
Ketones, ur: NEGATIVE
Leukocyte Esterase: NEGATIVE
Nitrites, Initial: NEGATIVE
Protein, ur: NEGATIVE
Specific Gravity, Urine: 1.025 (ref 1.001–1.035)
pH: 5.5 (ref 5.0–8.0)

## 2022-10-02 LAB — URINE CULTURE
MICRO NUMBER:: 14794398
Result:: NO GROWTH
SPECIMEN QUALITY:: ADEQUATE

## 2022-11-15 ENCOUNTER — Ambulatory Visit: Payer: BC Managed Care – PPO

## 2022-11-15 DIAGNOSIS — K642 Third degree hemorrhoids: Secondary | ICD-10-CM

## 2022-11-15 DIAGNOSIS — K5730 Diverticulosis of large intestine without perforation or abscess without bleeding: Secondary | ICD-10-CM

## 2022-11-15 DIAGNOSIS — D122 Benign neoplasm of ascending colon: Secondary | ICD-10-CM | POA: Diagnosis present

## 2022-11-25 ENCOUNTER — Ambulatory Visit: Payer: BC Managed Care – PPO | Admitting: Physical Therapy

## 2022-12-04 ENCOUNTER — Other Ambulatory Visit: Payer: Self-pay | Admitting: Internal Medicine

## 2022-12-04 DIAGNOSIS — I10 Essential (primary) hypertension: Secondary | ICD-10-CM

## 2022-12-13 ENCOUNTER — Ambulatory Visit: Payer: BC Managed Care – PPO | Admitting: Internal Medicine

## 2023-01-06 ENCOUNTER — Ambulatory Visit: Payer: BC Managed Care – PPO | Attending: Obstetrics and Gynecology | Admitting: Physical Therapy

## 2023-01-06 NOTE — Therapy (Deleted)
OUTPATIENT PHYSICAL THERAPY FEMALE PELVIC EVALUATION   Patient Name: Julia Diaz MRN: [id number] DOB:1960/11/25, 62 y.o., female Today's Date: 01/06/2023  END OF SESSION:   Past Medical History:  Diagnosis Date   Arthritis    Ear infection    Hypertension    Thyroid disease    Past Surgical History:  Procedure Laterality Date   BREAST EXCISIONAL BIOPSY Left    about 5 years ago she thinks - benign   BREAST SURGERY  02/22/2011   atypical ductyl hyperplasia of left breast   CHOLECYSTECTOMY N/A 03/05/2016   Procedure: LAPAROSCOPIC CHOLECYSTECTOMY WITH INTRAOPERATIVE CHOLANGIOGRAM;  Surgeon: Lattie Haw, MD;  Location: [facility] ORS;  Service: General;  Laterality: N/A;   JOINT REPLACEMENT Right 2010   KNEE SURGERY     right   XI ROBOTIC ASSISTED VENTRAL HERNIA N/A 07/06/2021   Procedure: XI ROBOTIC ASSISTED VENTRAL HERNIA (Incisional);  Surgeon: Carolan Shiver, MD;  Location: [facility] ORS;  Service: General;  Laterality: N/A;   Patient Active Problem List   Diagnosis Date Noted   Other specified hypothyroidism 08/15/2022   Gastroesophageal reflux disease without esophagitis 08/15/2022   Upper respiratory tract infection 08/15/2022   Rheumatoid factor positive 06/03/2022   Abnormal gait 09/10/2017   Foot pain 09/10/2017   Hemorrhoids 09/10/2017   Muscle weakness 09/10/2017   Osteoarthritis 09/10/2017   Atypical chest pain 08/20/2017   Palpitations 08/20/2017   Non-rheumatic mitral regurgitation 08/20/2017   Essential hypertension 08/20/2017   Choledocholithiasis 03/05/2016   Acute cholecystitis    Right sided sciatica 02/27/2015    PCP: Margaretann Loveless, MD   REFERRING PROVIDER: Patton Salles, MD   REFERRING DIAG: [phone] (ICD-10-CM) - Mixed incontinence   THERAPY DIAG:  No diagnosis found.  Rationale for Evaluation and Treatment: Rehabilitation  ONSET DATE: ***  SUBJECTIVE:                                                                                                                                                                                            SUBJECTIVE STATEMENT: *** Fluid intake: {Yes/No:304960894}   PAIN:  Are you having pain? {yes/no:20286} NPRS scale: ***/10 Pain location: {pelvic pain location:27098}  Pain type: {type:313116} Pain description: {PAIN DESCRIPTION:21022940}   Aggravating factors: *** Relieving factors: ***  PRECAUTIONS: {Therapy precautions:24002}  RED FLAGS: {PT Red Flags:29287}   WEIGHT BEARING RESTRICTIONS: {Yes ***/No:24003}  FALLS:  Has patient fallen in last 6 months? {fallsyesno:27318}  LIVING ENVIRONMENT: Lives with: {OP[facility] lives with:25569::"lives with their family"} Lives in: {Lives in:25570} Stairs: {opstairs:27293} Has following equipment at home: {Assistive devices:23999}  OCCUPATION: ***  PLOF: {PLOF:24004}  PATIENT GOALS: ***  PERTINENT HISTORY:  *** Sexual abuse: {Yes/No:304960894}  BOWEL MOVEMENT: Pain with bowel movement: {yes/no:20286} Type of bowel movement:{PT BM type:27100} Fully empty rectum: {Yes/No:304960894} Leakage: {Yes/No:304960894} Pads: {Yes/No:304960894} Fiber supplement: {Yes/No:304960894}  URINATION: Pain with urination: {yes/no:20286} Fully empty bladder: {Yes/No:304960894} Stream: {PT urination:27102} Urgency: {Yes/No:304960894} Frequency: *** Leakage: {PT leakage:27103} Pads: {Yes/No:304960894}  INTERCOURSE: Pain with intercourse: {pain with intercourse PA:27099} Ability to have vaginal penetration:  {Yes/No:304960894} Climax: *** Marinoff Scale: ***/3  PREGNANCY: Vaginal deliveries *** Tearing {Yes***/No:304960894} C-section deliveries *** Currently pregnant {Yes***/No:304960894}  PROLAPSE: {PT prolapse:27101}   OBJECTIVE:   DIAGNOSTIC FINDINGS:  ***  PATIENT SURVEYS:  {rehab surveys:24030}  PFIQ-7 ***  COGNITION: Overall cognitive status: {cognition:24006}     SENSATION: Light touch:  {intact/deficits:24005} Proprioception: {intact/deficits:24005}  MUSCLE LENGTH: Hamstrings: Right *** deg; Left *** deg Thomas test: Right *** deg; Left *** deg  LUMBAR SPECIAL TESTS:  {lumbar special test:25242}  FUNCTIONAL TESTS:  {Functional tests:24029}  GAIT: Distance walked: *** Assistive device utilized: {Assistive devices:23999} Level of assistance: {Levels of assistance:24026} Comments: ***  POSTURE: {posture:25561}  PELVIC ALIGNMENT:  LUMBARAROM/PROM:  A/PROM A/PROM  eval  Flexion   Extension   Right lateral flexion   Left lateral flexion   Right rotation   Left rotation    (Blank rows = not tested)  LOWER EXTREMITY ROM:  {AROM/PROM:27142} ROM Right eval Left eval  Hip flexion    Hip extension    Hip abduction    Hip adduction    Hip internal rotation    Hip external rotation    Knee flexion    Knee extension    Ankle dorsiflexion    Ankle plantarflexion    Ankle inversion    Ankle eversion     (Blank rows = not tested)  LOWER EXTREMITY MMT:  MMT Right eval Left eval  Hip flexion    Hip extension    Hip abduction    Hip adduction    Hip internal rotation    Hip external rotation    Knee flexion    Knee extension    Ankle dorsiflexion    Ankle plantarflexion    Ankle inversion    Ankle eversion     PALPATION:   General  ***                External Perineal Exam ***                             Internal Pelvic Floor ***  Patient confirms identification and approves PT to assess internal pelvic floor and treatment {yes/no:20286}  PELVIC MMT:   MMT eval  Vaginal   Internal Anal Sphincter   External Anal Sphincter   Puborectalis   Diastasis Recti   (Blank rows = not tested)        TONE: ***  PROLAPSE: ***  TODAY'S TREATMENT:  DATE: ***  EVAL ***   PATIENT EDUCATION:  Education details:  *** Person educated: {Person educated:25204} Education method: {Education Method:25205} Education comprehension: {Education Comprehension:25206}  HOME EXERCISE PROGRAM: ***  ASSESSMENT:  CLINICAL IMPRESSION: Patient is a *** y.o. *** who was seen today for physical therapy evaluation and treatment for ***.   OBJECTIVE IMPAIRMENTS: {opptimpairments:25111}.   ACTIVITY LIMITATIONS: {activitylimitations:27494}  PARTICIPATION LIMITATIONS: {participationrestrictions:25113}  PERSONAL FACTORS: {Personal factors:25162} are also affecting patient's functional outcome.   REHAB POTENTIAL: {rehabpotential:25112}  CLINICAL DECISION MAKING: {clinical decision making:25114}  EVALUATION COMPLEXITY: {Evaluation complexity:25115}   GOALS: Goals reviewed with patient? {yes/no:20286}  SHORT TERM GOALS: Target date: ***  *** Baseline: Goal status: INITIAL  2.  *** Baseline:  Goal status: INITIAL  3.  *** Baseline:  Goal status: INITIAL  4.  *** Baseline:  Goal status: INITIAL  5.  *** Baseline:  Goal status: INITIAL  6.  *** Baseline:  Goal status: INITIAL  LONG TERM GOALS: Target date: ***  *** Baseline:  Goal status: INITIAL  2.  *** Baseline:  Goal status: INITIAL  3.  *** Baseline:  Goal status: INITIAL  4.  *** Baseline:  Goal status: INITIAL  5.  *** Baseline:  Goal status: INITIAL  6.  *** Baseline:  Goal status: INITIAL  PLAN:  PT FREQUENCY: {rehab frequency:25116}  PT DURATION: {rehab duration:25117}  PLANNED INTERVENTIONS: {rehab planned interventions:25118::"Therapeutic exercises","Therapeutic activity","Neuromuscular re-education","Balance training","Gait training","Patient/Family education","Self Care","Joint mobilization"}  PLAN FOR NEXT SESSION: ***   Brayton Caves Cheyrl Buley, PT 01/06/2023, 7:53 AM

## 2023-03-10 ENCOUNTER — Encounter: Payer: Self-pay | Admitting: Internal Medicine

## 2023-03-10 ENCOUNTER — Ambulatory Visit (INDEPENDENT_AMBULATORY_CARE_PROVIDER_SITE_OTHER): Payer: BC Managed Care – PPO | Admitting: Internal Medicine

## 2023-03-10 VITALS — BP 124/84 | HR 62 | Ht 66.0 in | Wt 194.8 lb

## 2023-03-10 DIAGNOSIS — Z1231 Encounter for screening mammogram for malignant neoplasm of breast: Secondary | ICD-10-CM

## 2023-03-10 DIAGNOSIS — R7303 Prediabetes: Secondary | ICD-10-CM

## 2023-03-10 DIAGNOSIS — E782 Mixed hyperlipidemia: Secondary | ICD-10-CM | POA: Diagnosis not present

## 2023-03-10 DIAGNOSIS — I10 Essential (primary) hypertension: Secondary | ICD-10-CM | POA: Diagnosis not present

## 2023-03-10 DIAGNOSIS — Z1389 Encounter for screening for other disorder: Secondary | ICD-10-CM

## 2023-03-10 DIAGNOSIS — L304 Erythema intertrigo: Secondary | ICD-10-CM

## 2023-03-10 LAB — POCT URINALYSIS DIPSTICK
Bilirubin, UA: NEGATIVE
Glucose, UA: NEGATIVE
Ketones, UA: NEGATIVE
Leukocytes, UA: NEGATIVE
Nitrite, UA: NEGATIVE
Protein, UA: NEGATIVE
Spec Grav, UA: 1.02 (ref 1.010–1.025)
Urobilinogen, UA: 0.2 U/dL
pH, UA: 6.5 (ref 5.0–8.0)

## 2023-03-10 MED ORDER — HYDROXYZINE HCL 10 MG PO TABS: 10.0000 mg | ORAL_TABLET | Freq: Three times a day (TID) | ORAL | 0 refills | Status: DC | PRN

## 2023-03-10 MED ORDER — WEGOVY 0.25 MG/0.5ML ~~LOC~~ SOAJ
0.2500 mg | SUBCUTANEOUS | 0 refills | Status: DC
Start: 2023-03-10 — End: 2023-05-15

## 2023-03-10 MED ORDER — CLOTRIMAZOLE-BETAMETHASONE 1-0.05 % EX CREA
TOPICAL_CREAM | Freq: Two times a day (BID) | CUTANEOUS | 3 refills | Status: AC
Start: 2023-03-10 — End: ?

## 2023-03-10 NOTE — Progress Notes (Signed)
Established Patient Office Visit  Subjective:  Patient ID: Julia Diaz, female    DOB: 09-Nov-1960  Age: 62 y.o. MRN: [id number]  Chief Complaint  Patient presents with   Follow-up    7 month follow up    Patient comes in for follow up.Recently seen by Endocrine - managing her Hypothyroidism. Patient notes some ankle swelling- no pain ,mostly right s/p Right knee surgery. Advised to wear compression socks. Needs labs, mammo. Previously tried Adipex -P did not help and caused palpitations. Would like to try [facility]- has Prediabetes as well. C/o mild dysuria- u/a is clear- Notes a round itchy rash- will rx Lotrisone cream.    No other concerns at this time.   Past Medical History:  Diagnosis Date   Arthritis    Ear infection    Hypertension    Thyroid disease     Past Surgical History:  Procedure Laterality Date   BREAST EXCISIONAL BIOPSY Left    about 5 years ago she thinks - benign   BREAST SURGERY  02/22/2011   atypical ductyl hyperplasia of left breast   CHOLECYSTECTOMY N/A 03/05/2016   Procedure: LAPAROSCOPIC CHOLECYSTECTOMY WITH INTRAOPERATIVE CHOLANGIOGRAM;  Surgeon: Lattie Haw, MD;  Location: [facility] ORS;  Service: General;  Laterality: N/A;   JOINT REPLACEMENT Right 2010   KNEE SURGERY     right   XI ROBOTIC ASSISTED VENTRAL HERNIA N/A 07/06/2021   Procedure: XI ROBOTIC ASSISTED VENTRAL HERNIA (Incisional);  Surgeon: Carolan Shiver, MD;  Location: [facility] ORS;  Service: General;  Laterality: N/A;    Social History   Socioeconomic History   Marital status: Married    Spouse name: Not on file   Number of children: Not on file   Years of education: Not on file   Highest education level: Not on file  Occupational History   Not on file  Tobacco Use   Smoking status: Never   Smokeless tobacco: Never  Vaping Use   Vaping status: Never Used  Substance and Sexual Activity   Alcohol use: Yes    Comment: Few times a year   Drug use: No    Sexual activity: Yes    Birth control/protection: Post-menopausal  Other Topics Concern   Not on file  Social History Narrative   Not on file   Social Determinants of Health   Financial Resource Strain: Not on file  Food Insecurity: Not on file  Transportation Needs: Not on file  Physical Activity: Not on file  Stress: Not on file  Social Connections: Not on file  Intimate Partner Violence: Not on file    Family History  Problem Relation Age of Onset   Hypertension Mother    Hypertension Father    Diabetes Father    Breast cancer Cousin     Allergies  Allergen Reactions   Aspirin Swelling    Face, eyes. Patient does not remember if breathing was affected.  Face, eyes. Patient does not remember if breathing was affected. Face, eyes. Patient does not remember if breathing was affected. Face, eyes. Patient does not remember if breathing was affected.    Ace Inhibitors Cough    Note: cough   Amlodipine Other (See Comments)    Note: headaches    Review of Systems  Constitutional: Negative.  Negative for chills, fever, malaise/fatigue and weight loss.  HENT: Negative.    Eyes: Negative.   Respiratory: Negative.  Negative for cough and shortness of breath.   Cardiovascular: Negative.  Negative for chest pain,  palpitations and leg swelling.  Gastrointestinal: Negative.  Negative for abdominal pain, constipation, diarrhea, heartburn, nausea and vomiting.  Genitourinary: Negative.  Negative for dysuria and flank pain.  Musculoskeletal: Negative.  Negative for joint pain and myalgias.  Skin: Negative.   Neurological: Negative.  Negative for dizziness and headaches.  Endo/Heme/Allergies: Negative.   Psychiatric/Behavioral: Negative.  Negative for depression and suicidal ideas. The patient is not nervous/anxious.        Objective:   BP 124/84   Pulse 62   Ht 5\' 6"  (1.676 m)   Wt 194 lb 12.8 oz (88.4 kg)   SpO2 96%   BMI 31.44 kg/m   Vitals:   03/10/23 1407   BP: 124/84  Pulse: 62  Height: 5\' 6"  (1.676 m)  Weight: 194 lb 12.8 oz (88.4 kg)  SpO2: 96%  BMI (Calculated): 31.46    Physical Exam Vitals and nursing note reviewed.  Constitutional:      Appearance: Normal appearance.  HENT:     Head: Normocephalic and atraumatic.     Nose: Nose normal.     Mouth/Throat:     Mouth: Mucous membranes are moist.     Pharynx: Oropharynx is clear.  Eyes:     Conjunctiva/sclera: Conjunctivae normal.     Pupils: Pupils are equal, round, and reactive to light.  Cardiovascular:     Rate and Rhythm: Normal rate and regular rhythm.     Pulses: Normal pulses.     Heart sounds: Normal heart sounds. No murmur heard. Pulmonary:     Effort: Pulmonary effort is normal.     Breath sounds: Normal breath sounds. No wheezing.  Abdominal:     General: Bowel sounds are normal.     Palpations: Abdomen is soft.     Tenderness: There is no abdominal tenderness. There is no right CVA tenderness or left CVA tenderness.  Musculoskeletal:        General: Swelling (Right ankle) present. Normal range of motion.     Cervical back: Normal range of motion.     Right lower leg: No edema.     Left lower leg: No edema.  Skin:    General: Skin is warm and dry.  Neurological:     General: No focal deficit present.     Mental Status: She is alert and oriented to person, place, and time.  Psychiatric:        Mood and Affect: Mood normal.        Behavior: Behavior normal.      Results for orders placed or performed in visit on 03/10/23  POCT Urinalysis Dipstick (81002)  Result Value Ref Range   Color, UA yellow    Clarity, UA clear    Glucose, UA Negative Negative   Bilirubin, UA neg    Ketones, UA neg    Spec Grav, UA 1.020 1.010 - 1.025   Blood, UA trace    pH, UA 6.5 5.0 - 8.0   Protein, UA Negative Negative   Urobilinogen, UA 0.2 0.2 or 1.0 E.U./dL   Nitrite, UA neg    Leukocytes, UA Negative Negative   Appearance clear    Odor yes     Recent  Results (from the past 2160 hour(s))  POCT Urinalysis Dipstick ([id number])     Status: None   Collection Time: 03/10/23  3:09 PM  Result Value Ref Range   Color, UA yellow    Clarity, UA clear    Glucose, UA Negative Negative   Bilirubin, UA  neg    Ketones, UA neg    Spec Grav, UA 1.020 1.010 - 1.025   Blood, UA trace    pH, UA 6.5 5.0 - 8.0   Protein, UA Negative Negative   Urobilinogen, UA 0.2 0.2 or 1.0 E.U./dL   Nitrite, UA neg    Leukocytes, UA Negative Negative   Appearance clear    Odor yes       Assessment & Plan:  Check labs. If gets [facility] then follow up in 1 month Problem List Items Addressed This Visit     Essential hypertension   Relevant Orders   CMP14+EGFR   Other Visit Diagnoses     Breast cancer screening by mammogram    -  Primary   Relevant Orders   MM 3D SCREENING MAMMOGRAM BILATERAL BREAST   Mixed hyperlipidemia       Relevant Orders   Lipid Panel w/o Chol/HDL Ratio   Intertrigo       Relevant Medications   clotrimazole-betamethasone (LOTRISONE) cream   Prediabetes       Relevant Medications   Semaglutide-Weight Management (WEGOVY) 0.25 MG/0.5ML SOAJ   Other Relevant Orders   Hemoglobin A1c   Screening for blood or protein in urine       Relevant Orders   POCT Urinalysis Dipstick (16109) (Completed)       Return in about 1 month (around 04/09/2023).   Total time spent: 30 minutes  Margaretann Loveless, MD  03/10/2023   This document may have been prepared by [facility] Voice Recognition software and as such may include unintentional dictation errors.

## 2023-03-11 ENCOUNTER — Other Ambulatory Visit: Payer: BC Managed Care – PPO

## 2023-03-26 ENCOUNTER — Telehealth: Payer: Self-pay

## 2023-03-26 NOTE — Telephone Encounter (Signed)
Pt called requesting an rx refill of meloxicam- doesn't look like you've ever sent this in for her. She was prescribed it back on 02/25/2022 when she had ortho surgery- please advise

## 2023-03-27 ENCOUNTER — Other Ambulatory Visit: Payer: Self-pay | Admitting: Internal Medicine

## 2023-03-27 MED ORDER — MELOXICAM 15 MG PO TABS: 15.0000 mg | ORAL_TABLET | Freq: Every day | ORAL | 2 refills | Status: AC

## 2023-04-14 ENCOUNTER — Ambulatory Visit: Payer: BC Managed Care – PPO | Admitting: Internal Medicine

## 2023-04-18 ENCOUNTER — Ambulatory Visit
Admission: RE | Admit: 2023-04-18 | Discharge: 2023-04-18 | Disposition: A | Discharge status: Home / Self Care | Payer: BC Managed Care – PPO | Source: Ambulatory Visit | Attending: Internal Medicine | Admitting: Internal Medicine

## 2023-04-18 DIAGNOSIS — Z1231 Encounter for screening mammogram for malignant neoplasm of breast: Secondary | ICD-10-CM

## 2023-05-15 ENCOUNTER — Ambulatory Visit: Payer: BC Managed Care – PPO | Admitting: Internal Medicine

## 2023-05-15 ENCOUNTER — Encounter: Payer: Self-pay | Admitting: Internal Medicine

## 2023-05-15 VITALS — BP 120/82 | HR 83 | Ht 66.0 in | Wt 192.6 lb

## 2023-05-15 DIAGNOSIS — S0990XA Unspecified injury of head, initial encounter: Secondary | ICD-10-CM | POA: Diagnosis not present

## 2023-05-15 DIAGNOSIS — R7303 Prediabetes: Secondary | ICD-10-CM

## 2023-05-15 DIAGNOSIS — E782 Mixed hyperlipidemia: Secondary | ICD-10-CM

## 2023-05-15 NOTE — Progress Notes (Signed)
Established Patient Office Visit  Subjective:  Patient ID: Julia Diaz, female    DOB: 1960/09/22  Age: 62 y.o. MRN: [id number]  Chief Complaint  Patient presents with   Acute Visit    Larey Seat and hit head    Patient comes in with history of a fall about 1 week ago.  Patient reports that she was vacuuming, lost her balance most probably tripped and then fell backwards but hit the left side of her head on the door frame.  There was a loud noise at the time of impact but she did not pass out or lose consciousness.  The left side of her head was sore, tender, and she felt a small bump on that side.  There was no laceration of the scalp and no bruising.  Patient denies any headache, no nausea vomiting, no sensory or motor disturbances.  Since then although her pain and swelling over the left side of her head has improved but it is very tender to touch. Will check a CT head without contrast.     No other concerns at this time.   Past Medical History:  Diagnosis Date   Arthritis    Ear infection    Hypertension    Thyroid disease     Past Surgical History:  Procedure Laterality Date   BREAST EXCISIONAL BIOPSY Left    about 5 years ago she thinks - benign   BREAST SURGERY  02/22/2011   atypical ductyl hyperplasia of left breast   CHOLECYSTECTOMY N/A 03/05/2016   Procedure: LAPAROSCOPIC CHOLECYSTECTOMY WITH INTRAOPERATIVE CHOLANGIOGRAM;  Surgeon: Lattie Haw, MD;  Location: [facility] ORS;  Service: General;  Laterality: N/A;   JOINT REPLACEMENT Right 2010   KNEE SURGERY     right   XI ROBOTIC ASSISTED VENTRAL HERNIA N/A 07/06/2021   Procedure: XI ROBOTIC ASSISTED VENTRAL HERNIA (Incisional);  Surgeon: Carolan Shiver, MD;  Location: [facility] ORS;  Service: General;  Laterality: N/A;    Social History   Socioeconomic History   Marital status: Married    Spouse name: Not on file   Number of children: Not on file   Years of education: Not on file   Highest education level:  Not on file  Occupational History   Not on file  Tobacco Use   Smoking status: Never   Smokeless tobacco: Never  Vaping Use   Vaping status: Never Used  Substance and Sexual Activity   Alcohol use: Yes    Comment: Few times a year   Drug use: No   Sexual activity: Yes    Birth control/protection: Post-menopausal  Other Topics Concern   Not on file  Social History Narrative   Not on file   Social Determinants of Health   Financial Resource Strain: Low Risk  (04/03/2023)   Received from [facility]   Overall Financial Resource Strain (CARDIA)    Difficulty of Paying Living Expenses: Not very hard  Food Insecurity: No Food Insecurity (04/03/2023)   [facility]   Hunger Vital Sign    Worried About Running Out of Food in the Last Year: Never true    Ran Out of Food in the Last Year: Never true  Transportation Needs: No Transportation Needs (04/03/2023)   Received from [facility]    In the past 12 months, has lack of transportation kept you from medical appointments or from getting medications?: No    Lack of Transportation (  Non-Medical): No  Physical Activity: Not on file  Stress: Not on file  Social Connections: Not on file  Intimate Partner Violence: Not on file    Family History  Problem Relation Age of Onset   Hypertension Mother    Hypertension Father    Diabetes Father    Breast cancer Cousin     Allergies  Allergen Reactions   Aspirin Swelling    Face, eyes. Patient does not remember if breathing was affected.  Face, eyes. Patient does not remember if breathing was affected. Face, eyes. Patient does not remember if breathing was affected. Face, eyes. Patient does not remember if breathing was affected.    Ace Inhibitors Cough    Note: cough   Amlodipine Other (See Comments)    Note: headaches    Outpatient Medications Prior to Visit  Medication Sig    clotrimazole-betamethasone (LOTRISONE) cream Apply topically 2 (two) times daily.   levothyroxine (SYNTHROID) 150 MCG tablet Take 150 mcg by mouth daily before breakfast.   losartan-hydrochlorothiazide (HYZAAR) 100-12.5 MG tablet TAKE 1 TABLET BY MOUTH EVERY DAY   meloxicam (MOBIC) 15 MG tablet Take 1 tablet (15 mg total) by mouth daily.   [DISCONTINUED] amLODipine (NORVASC) 5 MG tablet  (Patient not taking: Reported on 03/10/2023)   [DISCONTINUED] hydrOXYzine (ATARAX) 10 MG tablet Take 1 tablet (10 mg total) by mouth 3 (three) times daily as needed.   [DISCONTINUED] Semaglutide-Weight Management (WEGOVY) 0.25 MG/0.5ML SOAJ Inject 0.25 mg into the skin once a week.   No facility-administered medications prior to visit.    Review of Systems  Constitutional: Negative.  Negative for chills, diaphoresis, fever, malaise/fatigue and weight loss.  HENT: Negative.  Negative for congestion, ear discharge, ear pain, sinus pain and sore throat.   Eyes: Negative.  Negative for blurred vision, double vision, photophobia, pain, discharge and redness.  Respiratory: Negative.  Negative for cough and shortness of breath.   Cardiovascular: Negative.  Negative for chest pain, palpitations and leg swelling.  Gastrointestinal: Negative.  Negative for abdominal pain, constipation, diarrhea, heartburn, nausea and vomiting.  Genitourinary: Negative.  Negative for dysuria and flank pain.  Musculoskeletal: Negative.  Negative for joint pain and myalgias.  Skin: Negative.   Neurological:  Negative for dizziness, tingling, tremors, sensory change, speech change, focal weakness, seizures, loss of consciousness, weakness and headaches.  Endo/Heme/Allergies: Negative.   Psychiatric/Behavioral: Negative.  Negative for depression and suicidal ideas. The patient is not nervous/anxious.        Objective:   BP 120/82   Pulse 83   Ht 5\' 6"  (1.676 m)   Wt 192 lb 9.6 oz (87.4 kg)   SpO2 96%   BMI 31.09 kg/m   Vitals:    05/15/23 0842  BP: 120/82  Pulse: 83  Height: 5\' 6"  (1.676 m)  Weight: 192 lb 9.6 oz (87.4 kg)  SpO2: 96%  BMI (Calculated): 31.1    Physical Exam Vitals and nursing note reviewed.  Constitutional:      Appearance: Normal appearance.  HENT:     Head: Normocephalic and atraumatic.     Nose: Nose normal.     Mouth/Throat:     Mouth: Mucous membranes are moist.     Pharynx: Oropharynx is clear.  Eyes:     Conjunctiva/sclera: Conjunctivae normal.     Pupils: Pupils are equal, round, and reactive to light.  Cardiovascular:     Rate and Rhythm: Normal rate and regular rhythm.     Pulses: Normal pulses.  Heart sounds: Normal heart sounds. No murmur heard. Pulmonary:     Effort: Pulmonary effort is normal.     Breath sounds: Normal breath sounds. No wheezing.  Abdominal:     General: Bowel sounds are normal.     Palpations: Abdomen is soft.     Tenderness: There is no abdominal tenderness. There is no right CVA tenderness or left CVA tenderness.  Musculoskeletal:        General: Normal range of motion.     Cervical back: Normal range of motion.     Right lower leg: No edema.     Left lower leg: No edema.  Skin:    General: Skin is warm and dry.  Neurological:     General: No focal deficit present.     Mental Status: She is alert and oriented to person, place, and time.  Psychiatric:        Mood and Affect: Mood normal.        Behavior: Behavior normal.      No results found for any visits on 05/15/23.  Recent Results (from the past 2160 hour(s))  POCT Urinalysis Dipstick ([id number])     Status: None   Collection Time: 03/10/23  3:09 PM  Result Value Ref Range   Color, UA yellow    Clarity, UA clear    Glucose, UA Negative Negative   Bilirubin, UA neg    Ketones, UA neg    Spec Grav, UA 1.020 1.010 - 1.025   Blood, UA trace    pH, UA 6.5 5.0 - 8.0   Protein, UA Negative Negative   Urobilinogen, UA 0.2 0.2 or 1.0 E.U./dL   Nitrite, UA neg    Leukocytes, UA  Negative Negative   Appearance clear    Odor yes   CMP14+EGFR     Status: None   Collection Time: 03/11/23  9:03 AM  Result Value Ref Range   Glucose 91 70 - 99 mg/dL   BUN 17 8 - 27 mg/dL   Creatinine, Ser 9.62 0.57 - 1.00 mg/dL   eGFR 90 >95 MW/UXL/2.44   BUN/Creatinine Ratio 23 12 - 28   Sodium 142 134 - 144 mmol/L   Potassium 4.7 3.5 - 5.2 mmol/L   Chloride 103 96 - 106 mmol/L   CO2 24 20 - 29 mmol/L   Calcium 9.3 8.7 - 10.3 mg/dL   Total Protein 6.9 6.0 - 8.5 g/dL   Albumin 4.2 3.9 - 4.9 g/dL   Globulin, Total 2.7 1.5 - 4.5 g/dL   Bilirubin Total 0.5 0.0 - 1.2 mg/dL   Alkaline Phosphatase 84 44 - 121 IU/L   AST 19 0 - 40 IU/L   ALT 16 0 - 32 IU/L  Lipid Panel w/o Chol/HDL Ratio     Status: Abnormal   Collection Time: 03/11/23  9:03 AM  Result Value Ref Range   Cholesterol, Total 184 100 - 199 mg/dL   Triglycerides 94 0 - 149 mg/dL   HDL 53 >01 mg/dL   VLDL Cholesterol Cal 17 5 - 40 mg/dL   LDL Chol Calc (NIH) 027 (H) 0 - 99 mg/dL  Hemoglobin O5D     Status: Abnormal   Collection Time: 03/11/23  9:03 AM  Result Value Ref Range   Hgb A1c MFr Bld 5.7 (H) 4.8 - 5.6 %    Comment:          Prediabetes: 5.7 - 6.4          Diabetes: >6.4  Glycemic control for adults with diabetes: <7.0    Est. average glucose Bld gHb Est-mCnc 117 mg/dL      Assessment & Plan:  Patient reassured.  Will check a CT of the head without contrast.  Continue to monitor for any neurological deficits.  We discussed results once available. If negative then follow-up as scheduled. Problem List Items Addressed This Visit   None Visit Diagnoses     Traumatic injury of head, initial encounter    -  Primary   Relevant Orders   CT HEAD WO CONTRAST ( )   Mixed hyperlipidemia       Prediabetes           Return in about 4 months (around 09/12/2023).   Total time spent: 30 minutes  Margaretann Loveless, MD  05/15/2023   This document may have been prepared by [facility] Voice Recognition  software and as such may include unintentional dictation errors.

## 2023-05-16 ENCOUNTER — Ambulatory Visit
Admission: RE | Admit: 2023-05-16 | Discharge: 2023-05-16 | Disposition: A | Discharge status: Home / Self Care | Payer: BC Managed Care – PPO | Source: Ambulatory Visit | Attending: Internal Medicine | Admitting: Internal Medicine

## 2023-05-16 DIAGNOSIS — S0990XA Unspecified injury of head, initial encounter: Secondary | ICD-10-CM | POA: Insufficient documentation

## 2023-10-03 ENCOUNTER — Ambulatory Visit (INDEPENDENT_AMBULATORY_CARE_PROVIDER_SITE_OTHER): Payer: Self-pay | Admitting: Internal Medicine

## 2023-10-03 ENCOUNTER — Encounter: Payer: Self-pay | Admitting: Internal Medicine

## 2023-10-03 VITALS — BP 134/88 | HR 73 | Ht 66.0 in | Wt 193.8 lb

## 2023-10-03 DIAGNOSIS — R7303 Prediabetes: Secondary | ICD-10-CM | POA: Diagnosis not present

## 2023-10-03 DIAGNOSIS — R300 Dysuria: Secondary | ICD-10-CM | POA: Diagnosis not present

## 2023-10-03 DIAGNOSIS — E782 Mixed hyperlipidemia: Secondary | ICD-10-CM | POA: Diagnosis not present

## 2023-10-03 DIAGNOSIS — I10 Essential (primary) hypertension: Secondary | ICD-10-CM | POA: Diagnosis not present

## 2023-10-03 DIAGNOSIS — K219 Gastro-esophageal reflux disease without esophagitis: Secondary | ICD-10-CM

## 2023-10-03 DIAGNOSIS — Z1389 Encounter for screening for other disorder: Secondary | ICD-10-CM

## 2023-10-03 DIAGNOSIS — R002 Palpitations: Secondary | ICD-10-CM

## 2023-10-03 DIAGNOSIS — E038 Other specified hypothyroidism: Secondary | ICD-10-CM

## 2023-10-03 DIAGNOSIS — I340 Nonrheumatic mitral (valve) insufficiency: Secondary | ICD-10-CM

## 2023-10-03 LAB — POCT URINALYSIS DIPSTICK
Bilirubin, UA: NEGATIVE
Glucose, UA: NEGATIVE
Ketones, UA: NEGATIVE
Nitrite, UA: NEGATIVE
Protein, UA: NEGATIVE
Spec Grav, UA: 1.015 (ref 1.010–1.025)
Urobilinogen, UA: 0.2 U/dL
pH, UA: 7 (ref 5.0–8.0)

## 2023-10-03 MED ORDER — CIPROFLOXACIN HCL 500 MG PO TABS: 500.0000 mg | ORAL_TABLET | Freq: Two times a day (BID) | ORAL | 0 refills | Status: AC

## 2023-10-03 NOTE — Progress Notes (Signed)
 Established Patient Office Visit  Subjective:  Patient ID: Julia Diaz, female    DOB: 10/20/1960  Age: 63 y.o. MRN: [id number]  Chief Complaint  Patient presents with   Follow-up    4 month follow up    Patient comes in for follow-up today.  She has several complaints.  She mentions some lack of motivation to be more physically active.  Her PHQ-9/GAD score is 8/5.  She has history of chronic anxiety but has refused to take medications.  Will think about starting Cymbalta or Zoloft and will let me know. Complains of intermittent rapid heartbeat can, can happen at anytime of the day.  Her EKG today shows normal sinus rhythm.  Admits to drinking caffeinated products.  She does have a murmur of mitral regurgitation.  Will repeat her echocardiogram. Complains of mild dysuria, urine dipstick shows pus cells.  Will send for culture sensitivity and empirically start p.o. Cipro for 5 days.  Also has ear pressure due to seasonal allergies.  On exam both tympanic membranes are opaque, not red or irritated.  Needs to continue her Flonase nasal spray and antihistamine.    No other concerns at this time.   Past Medical History:  Diagnosis Date   Arthritis    Ear infection    Hypertension    Thyroid disease     Past Surgical History:  Procedure Laterality Date   BREAST EXCISIONAL BIOPSY Left    about 5 years ago she thinks - benign   BREAST SURGERY  02/22/2011   atypical ductyl hyperplasia of left breast   CHOLECYSTECTOMY N/A 03/05/2016   Procedure: LAPAROSCOPIC CHOLECYSTECTOMY WITH INTRAOPERATIVE CHOLANGIOGRAM;  Surgeon: Lattie Haw, MD;  Location: [facility] OR[facility];  Service: General;  Laterality: N/A;   JOINT REPLACEMENT Right 2010   KNEE SURGERY     right   XI ROBOTIC ASSISTED VENTRAL HERNIA N/A 07/06/2021   Procedure: XI ROBOTIC ASSISTED VENTRAL HERNIA (Incisional);  Surgeon: Carolan Shiver, MD;  Location: [facility] OR[facility];  Service: General;  Laterality: N/A;    Social History    Socioeconomic History   Marital status: Married    Spouse name: Not on file   Number of children: Not on file   Years of education: Not on file   Highest education level: Not on file  Occupational History   Not on file  Tobacco Use   Smoking status: Never   Smokeless tobacco: Never  Vaping Use   Vaping status: Never Used  Substance and Sexual Activity   Alcohol use: Yes    Comment: Few times a year   Drug use: No   Sexual activity: Yes    Birth control/protection: Post-menopausal  Other Topics Concern   Not on file  Social History Narrative   Not on file   Social Drivers of Health   Financial Resource Strain: Low Risk  (04/03/2023)   Received from [facility]   Overall Financial Resource Strain (CARDIA)    Difficulty of Paying Living Expenses: Not very hard  Food Insecurity: No Food Insecurity (04/03/2023)   Received from [facility]   Hunger Vital Sign    Worried About Running Out of Food in the Last Year: Never true    Ran Out of Food in the Last Year: Never true  Transportation Needs: No Transportation Needs (04/03/2023)   Received from [facility] - Transportation    In the past 12 months, has lack of transportation kept you from  medical appointments or from getting medications?: No    Lack of Transportation (Non-Medical): No  Physical Activity: Not on file  Stress: Not on file  Social Connections: Not on file  Intimate Partner Violence: Not on file    Family History  Problem Relation Age of Onset   Hypertension Mother    Hypertension Father    Diabetes Father    Breast cancer Cousin     Allergies  Allergen Reactions   Aspirin Swelling    Face, eyes. Patient does not remember if breathing was affected.  Face, eyes. Patient does not remember if breathing was affected. Face, eyes. Patient does not remember if breathing was affected. Face, eyes. Patient does not remember if breathing was  affected.    Ace Inhibitors Cough    Note: cough   Amlodipine Other (See Comments)    Note: headaches    Outpatient Medications Prior to Visit  Medication Sig   clotrimazole-betamethasone (LOTRISONE) cream Apply topically 2 (two) times daily.   levothyroxine (SYNTHROID) 150 MCG tablet Take 150 mcg by mouth daily before breakfast.   losartan-hydrochlorothiazide (HYZAAR) 100-12.5 MG tablet TAKE 1 TABLET BY MOUTH EVERY DAY   meloxicam (MOBIC) 15 MG tablet Take 1 tablet (15 mg total) by mouth daily.   No facility-administered medications prior to visit.    Review of Systems  Constitutional: Negative.  Negative for chills, fever, malaise/fatigue and weight loss.  HENT:  Positive for congestion and ear pain. Negative for sore throat.   Eyes: Negative.  Negative for blurred vision.  Respiratory: Negative.  Negative for cough, shortness of breath and stridor.   Cardiovascular: Negative.  Negative for chest pain, palpitations and leg swelling.  Gastrointestinal: Negative.  Negative for abdominal pain, constipation, diarrhea, heartburn, nausea and vomiting.  Genitourinary: Negative.  Negative for dysuria and flank pain.  Musculoskeletal:  Positive for joint pain (left knee). Negative for myalgias.  Skin: Negative.   Neurological: Negative.  Negative for dizziness and headaches.  Endo/Heme/Allergies: Negative.   Psychiatric/Behavioral:  Negative for depression and suicidal ideas. The patient is nervous/anxious.        Objective:   BP 134/88   Pulse 73   Ht 5\' 6"  (1.676 m)   Wt 193 lb 12.8 oz (87.9 kg)   SpO2 96%   BMI 31.28 kg/m   Vitals:   10/03/23 0929  BP: 134/88  Pulse: 73  Height: 5\' 6"  (1.676 m)  Weight: 193 lb 12.8 oz (87.9 kg)  SpO2: 96%  BMI (Calculated): 31.3    Physical Exam Vitals and nursing note reviewed.  Constitutional:      Appearance: Normal appearance.  HENT:     Head: Normocephalic and atraumatic.     Nose: Nose normal.     Mouth/Throat:      Mouth: Mucous membranes are moist.     Pharynx: Oropharynx is clear.  Eyes:     Conjunctiva/sclera: Conjunctivae normal.     Pupils: Pupils are equal, round, and reactive to light.  Cardiovascular:     Rate and Rhythm: Normal rate and regular rhythm.     Pulses: Normal pulses.     Heart sounds: Normal heart sounds. No murmur heard. Pulmonary:     Effort: Pulmonary effort is normal.     Breath sounds: Normal breath sounds. No wheezing.  Abdominal:     General: Bowel sounds are normal.     Palpations: Abdomen is soft.     Tenderness: There is no abdominal tenderness. There is no right CVA  tenderness or left CVA tenderness.  Musculoskeletal:        General: Normal range of motion.     Cervical back: Normal range of motion.     Right lower leg: No edema.     Left lower leg: No edema.  Skin:    General: Skin is warm and dry.  Neurological:     General: No focal deficit present.     Mental Status: She is alert and oriented to person, place, and time.  Psychiatric:        Mood and Affect: Mood normal.        Behavior: Behavior normal.      Results for orders placed or performed in visit on 10/03/23  POCT Urinalysis Dipstick (81002)  Result Value Ref Range   Color, UA Yellow    Clarity, UA Clear    Glucose, UA Negative Negative   Bilirubin, UA Negative    Ketones, UA Negative    Spec Grav, UA 1.015 1.010 - 1.025   Blood, UA Small    pH, UA 7.0 5.0 - 8.0   Protein, UA Negative Negative   Urobilinogen, UA 0.2 0.2 or 1.0 E.U./dL   Nitrite, UA Negative    Leukocytes, UA Small (1+) (A) Negative   Appearance Clear    Odor No     Recent Results (from the past 2160 hours)  POCT Urinalysis Dipstick (09811)     Status: Abnormal   Collection Time: 10/03/23  9:58 AM  Result Value Ref Range   Color, UA Yellow    Clarity, UA Clear    Glucose, UA Negative Negative   Bilirubin, UA Negative    Ketones, UA Negative    Spec Grav, UA 1.015 1.010 - 1.025   Blood, UA Small    pH, UA  7.0 5.0 - 8.0   Protein, UA Negative Negative   Urobilinogen, UA 0.2 0.2 or 1.0 E.U./dL   Nitrite, UA Negative    Leukocytes, UA Small (1+) (A) Negative   Appearance Clear    Odor No       Assessment & Plan:  Continue current medications.  Check 2D echocardiogram.  Labs today.  Monitor blood pressure.  P.o. Cipro for UTI. Follow-up in 2 weeks to discuss results. Problem List Items Addressed This Visit     Palpitations   Relevant Orders   EKG 12-Lead   Nonrheumatic mitral valve regurgitation   Relevant Orders   PCV ECHOCARDIOGRAM COMPLETE   Essential hypertension - Primary   Relevant Orders   CMP14+EGFR   Other specified hypothyroidism   Relevant Orders   TSH+T4F+T3Free   Gastroesophageal reflux disease without esophagitis   Prediabetes   Relevant Orders   Hemoglobin A1c   Mixed hyperlipidemia   Relevant Orders   Lipid Panel w/o Chol/HDL Ratio   Other Visit Diagnoses       Screening for blood or protein in urine       Relevant Orders   POCT Urinalysis Dipstick (91478) (Completed)     Dysuria       Relevant Medications   ciprofloxacin (CIPRO) 500 MG tablet   Other Relevant Orders   Urine Culture       Return in about 2 weeks (around 10/17/2023).   Total time spent: 30 minutes  Margaretann Loveless, MD  10/03/2023   This document may have been prepared by [facility] Voice Recognition software and as such may include unintentional dictation errors.

## 2023-10-04 LAB — CMP14+EGFR
ALT: 22 IU/L (ref 0–32)
AST: 20 IU/L (ref 0–40)
Albumin: 4.1 g/dL (ref 3.9–4.9)
Alkaline Phosphatase: 81 IU/L (ref 44–121)
BUN/Creatinine Ratio: 28 (ref 12–28)
BUN: 21 mg/dL (ref 8–27)
Bilirubin Total: 0.5 mg/dL (ref 0.0–1.2)
CO2: 25 mmol/L (ref 20–29)
Calcium: 9.8 mg/dL (ref 8.7–10.3)
Chloride: 103 mmol/L (ref 96–106)
Creatinine, Ser: 0.75 mg/dL (ref 0.57–1.00)
Globulin, Total: 2.9 g/dL (ref 1.5–4.5)
Glucose: 90 mg/dL (ref 70–99)
Potassium: 4.4 mmol/L (ref 3.5–5.2)
Sodium: 141 mmol/L (ref 134–144)
Total Protein: 7 g/dL (ref 6.0–8.5)
eGFR: 89 mL/min/{1.73_m2} (ref >59)

## 2023-10-04 LAB — TSH+T4F+T3FREE
Free T4: 1.54 ng/dL (ref 0.82–1.77)
T3, Free: 2.3 pg/mL (ref 2.0–4.4)
TSH: 4.09 u[IU]/mL (ref 0.450–4.500)

## 2023-10-04 LAB — LIPID PANEL W/O CHOL/HDL RATIO
Cholesterol, Total: 181 mg/dL (ref 100–199)
HDL: 57 mg/dL (ref >39)
LDL Chol Calc (NIH): 113 mg/dL — ABNORMAL HIGH (ref 0–99)
Triglycerides: 58 mg/dL (ref 0–149)
VLDL Cholesterol Cal: 11 mg/dL (ref 5–40)

## 2023-10-04 LAB — HEMOGLOBIN A1C
Est. average glucose Bld gHb Est-mCnc: 120 mg/dL
Hgb A1c MFr Bld: 5.8 % — ABNORMAL HIGH (ref 4.8–5.6)

## 2023-10-05 LAB — URINE CULTURE: Organism ID, Bacteria: NO GROWTH

## 2023-10-13 ENCOUNTER — Other Ambulatory Visit

## 2023-10-17 ENCOUNTER — Ambulatory Visit

## 2023-10-17 ENCOUNTER — Ambulatory Visit: Admitting: Internal Medicine

## 2023-10-17 ENCOUNTER — Encounter: Payer: Self-pay | Admitting: Internal Medicine

## 2023-10-17 VITALS — BP 122/84 | HR 75 | Ht 66.0 in | Wt 193.4 lb

## 2023-10-17 DIAGNOSIS — E782 Mixed hyperlipidemia: Secondary | ICD-10-CM

## 2023-10-17 DIAGNOSIS — R7303 Prediabetes: Secondary | ICD-10-CM

## 2023-10-17 DIAGNOSIS — F411 Generalized anxiety disorder: Secondary | ICD-10-CM | POA: Diagnosis not present

## 2023-10-17 DIAGNOSIS — I10 Essential (primary) hypertension: Secondary | ICD-10-CM | POA: Diagnosis not present

## 2023-10-17 MED ORDER — SERTRALINE HCL 50 MG PO TABS: 50.0000 mg | ORAL_TABLET | Freq: Every day | ORAL | 2 refills | Status: AC

## 2023-10-17 NOTE — Progress Notes (Signed)
 Established Patient Office Visit  Subjective:  Patient ID: Julia Diaz, female    DOB: 1961/01/07  Age: 63 y.o. MRN: [id number]  Chief Complaint  Patient presents with   Follow-up    2 week follow up    Patient comes in for follow-up today.  She is feeling much better.  And does not have any further ear pressure or pain since she is taking her nasal spray and antihistamine.  She also took Cipro  for pyuria, but her urine culture did not grow anything significant.  Lab results discussed today.  She needs to increase her diet control.  Her echocardiogram got rescheduled.  Denies any chest pain or shortness of breath. She asks today to try Zoloft  for her anxiety but at a very small dose.  Will send in a prescription for 50 mg but she will take half a tablet for the next 2 weeks and then increasing to a full tablet as tolerated.    No other concerns at this time.   Past Medical History:  Diagnosis Date   Arthritis    Ear infection    Hypertension    Thyroid  disease     Past Surgical History:  Procedure Laterality Date   BREAST EXCISIONAL BIOPSY Left    about 5 years ago she thinks - benign   BREAST SURGERY  02/22/2011   atypical ductyl hyperplasia of left breast   CHOLECYSTECTOMY N/A 03/05/2016   Procedure: LAPAROSCOPIC CHOLECYSTECTOMY WITH INTRAOPERATIVE CHOLANGIOGRAM;  Surgeon: Claudia Cuff, MD;  Location: [facility] ORS;  Service: General;  Laterality: N/A;   JOINT REPLACEMENT Right 2010   KNEE SURGERY     right   XI ROBOTIC ASSISTED VENTRAL HERNIA N/A 07/06/2021   Procedure: XI ROBOTIC ASSISTED VENTRAL HERNIA (Incisional);  Surgeon: Eldred Grego, MD;  Location: [facility] OR[facility];  Service: General;  Laterality: N/A;    Social History   Socioeconomic History   Marital status: Married    Spouse name: Not on file   Number of children: Not on file   Years of education: Not on file   Highest education level: Not on file  Occupational History   Not on file  Tobacco  Use   Smoking status: Never   Smokeless tobacco: Never  Vaping Use   Vaping status: Never Used  Substance and Sexual Activity   Alcohol use: Yes    Comment: Few times a year   Drug use: No   Sexual activity: Yes    Birth control/protection: Post-menopausal  Other Topics Concern   Not on file  Social History Narrative   Not on file   Social Drivers of Health   Financial Resource Strain: Low Risk  (04/03/2023)   Received from [facility]   Overall Financial Resource Strain (CARDIA)    Difficulty of Paying Living Expenses: Not very hard  Food Insecurity: No Food Insecurity (04/03/2023)   Received from [facility]   Hunger Vital Sign    Worried About Running Out of Food in the Last Year: Never true    Ran Out of Food in the Last Year: Never true  Transportation Needs: No Transportation Needs (04/03/2023)   Received from [facility] - Transportation    In the past 12 months, has lack of transportation kept you from medical appointments or from getting medications?: No    Lack of Transportation (Non-Medical): No  Physical Activity: Not on file  Stress: Not on file  Social Connections: Not  on file  Intimate Partner Violence: Not on file    Family History  Problem Relation Age of Onset   Hypertension Mother    Hypertension Father    Diabetes Father    Breast cancer Cousin     Allergies  Allergen Reactions   Aspirin Swelling    Face, eyes. Patient does not remember if breathing was affected.  Face, eyes. Patient does not remember if breathing was affected. Face, eyes. Patient does not remember if breathing was affected. Face, eyes. Patient does not remember if breathing was affected.    Ace Inhibitors Cough    Note: cough   Amlodipine Other (See Comments)    Note: headaches    Outpatient Medications Prior to Visit  Medication Sig   clotrimazole -betamethasone  (LOTRISONE ) cream Apply topically 2 (two)  times daily.   levothyroxine  (SYNTHROID ) 150 MCG tablet Take 150 mcg by mouth daily before breakfast.   losartan-hydrochlorothiazide  (HYZAAR) 100-12.5 MG tablet TAKE 1 TABLET BY MOUTH EVERY DAY   meloxicam  (MOBIC ) 15 MG tablet Take 1 tablet (15 mg total) by mouth daily.   No facility-administered medications prior to visit.    Review of Systems  Constitutional: Negative.  Negative for chills, fever, malaise/fatigue and weight loss.  HENT: Negative.  Negative for congestion, ear discharge, ear pain, sinus pain and sore throat.   Eyes: Negative.   Respiratory: Negative.  Negative for cough and shortness of breath.   Cardiovascular: Negative.  Negative for chest pain, palpitations and leg swelling.  Gastrointestinal: Negative.  Negative for abdominal pain, constipation, diarrhea, heartburn, nausea and vomiting.  Genitourinary: Negative.  Negative for dysuria, flank pain, frequency and urgency.  Musculoskeletal: Negative.  Negative for joint pain and myalgias.  Skin: Negative.   Neurological: Negative.  Negative for dizziness and headaches.  Endo/Heme/Allergies: Negative.   Psychiatric/Behavioral:  Negative for depression and suicidal ideas. The patient is nervous/anxious.        Objective:   BP 122/84   Pulse 75   Ht 5\' 6"  (1.676 m)   Wt 193 lb 6.4 oz (87.7 kg)   SpO2 98%   BMI 31.22 kg/m   Vitals:   10/17/23 0853  BP: 122/84  Pulse: 75  Height: 5\' 6"  (1.676 m)  Weight: 193 lb 6.4 oz (87.7 kg)  SpO2: 98%  BMI (Calculated): 31.23    Physical Exam Vitals and nursing note reviewed.  Constitutional:      Appearance: Normal appearance.  HENT:     Head: Normocephalic and atraumatic.     Nose: Nose normal.     Mouth/Throat:     Mouth: Mucous membranes are moist.     Pharynx: Oropharynx is clear.  Eyes:     Conjunctiva/sclera: Conjunctivae normal.     Pupils: Pupils are equal, round, and reactive to light.  Cardiovascular:     Rate and Rhythm: Normal rate and regular  rhythm.     Pulses: Normal pulses.     Heart sounds: Normal heart sounds. No murmur heard. Pulmonary:     Effort: Pulmonary effort is normal.     Breath sounds: Normal breath sounds. No wheezing.  Abdominal:     General: Bowel sounds are normal.     Palpations: Abdomen is soft.     Tenderness: There is no abdominal tenderness. There is no right CVA tenderness or left CVA tenderness.  Musculoskeletal:        General: Normal range of motion.     Cervical back: Normal range of motion.  Right lower leg: No edema.     Left lower leg: No edema.  Skin:    General: Skin is warm and dry.  Neurological:     General: No focal deficit present.     Mental Status: She is alert and oriented to person, place, and time.  Psychiatric:        Mood and Affect: Mood normal.        Behavior: Behavior normal.      No results found for any visits on 10/17/23.  Recent Results (from the past 2160 hours)  POCT Urinalysis Dipstick (95621)     Status: Abnormal   Collection Time: 10/03/23  9:58 AM  Result Value Ref Range   Color, UA Yellow    Clarity, UA Clear    Glucose, UA Negative Negative   Bilirubin, UA Negative    Ketones, UA Negative    Spec Grav, UA 1.015 1.010 - 1.025   Blood, UA Small    pH, UA 7.0 5.0 - 8.0   Protein, UA Negative Negative   Urobilinogen, UA 0.2 0.2 or 1.0 E.U./dL   Nitrite, UA Negative    Leukocytes, UA Small (1+) (A) Negative   Appearance Clear    Odor No   TSH+T4F+T3Free     Status: None   Collection Time: 10/03/23 10:19 AM  Result Value Ref Range   TSH 4.090 0.450 - 4.500 uIU/mL   T3, Free 2.3 2.0 - 4.4 pg/mL   Free T4 1.54 0.82 - 1.77 ng/dL  Lipid Panel w/o Chol/HDL Ratio     Status: Abnormal   Collection Time: 10/03/23 10:19 AM  Result Value Ref Range   Cholesterol, Total 181 100 - 199 mg/dL   Triglycerides 58 0 - 149 mg/dL   HDL 57 >30 mg/dL   VLDL Cholesterol Cal 11 5 - 40 mg/dL   LDL Chol Calc (NIH) 865 (H) 0 - 99 mg/dL  HQI69+GEXB     Status:  None   Collection Time: 10/03/23 10:19 AM  Result Value Ref Range   Glucose 90 70 - 99 mg/dL   BUN 21 8 - 27 mg/dL   Creatinine, Ser 2.84 0.57 - 1.00 mg/dL   eGFR 89 >13 KG/MWN/0.27   BUN/Creatinine Ratio 28 12 - 28   Sodium 141 134 - 144 mmol/L   Potassium 4.4 3.5 - 5.2 mmol/L   Chloride 103 96 - 106 mmol/L   CO2 25 20 - 29 mmol/L   Calcium 9.8 8.7 - 10.3 mg/dL   Total Protein 7.0 6.0 - 8.5 g/dL   Albumin 4.1 3.9 - 4.9 g/dL   Globulin, Total 2.9 1.5 - 4.5 g/dL   Bilirubin Total 0.5 0.0 - 1.2 mg/dL   Alkaline Phosphatase 81 44 - 121 IU/L   AST 20 0 - 40 IU/L   ALT 22 0 - 32 IU/L  Hemoglobin A1c     Status: Abnormal   Collection Time: 10/03/23 10:19 AM  Result Value Ref Range   Hgb A1c MFr Bld 5.8 (H) 4.8 - 5.6 %    Comment:          Prediabetes: 5.7 - 6.4          Diabetes: >6.4          Glycemic control for adults with diabetes: <7.0    Est. average glucose Bld gHb Est-mCnc 120 mg/dL  Urine Culture     Status: None   Collection Time: 10/03/23  2:18 PM   Specimen: Urine   UR  Result Value Ref Range   Urine Culture, Routine Final report    Organism ID, Bacteria No growth       Assessment & Plan:  Continue current medications.  Zoloft added. Problem List Items Addressed This Visit     Essential hypertension - Primary   Prediabetes   Mixed hyperlipidemia   Other Visit Diagnoses       GAD (generalized anxiety disorder)       Relevant Medications   sertraline (ZOLOFT) 50 MG tablet       Return in about 3 months (around 01/16/2024).   Total time spent: 30 minutes  Aisha Hove, MD  10/17/2023   This document may have been prepared by [facility] Voice Recognition software and as such may include unintentional dictation errors.

## 2023-12-25 ENCOUNTER — Encounter: Payer: Self-pay | Admitting: Internal Medicine

## 2023-12-25 ENCOUNTER — Ambulatory Visit: Admitting: Internal Medicine

## 2023-12-25 ENCOUNTER — Ambulatory Visit: Payer: Self-pay | Admitting: Internal Medicine

## 2023-12-25 VITALS — BP 110/78 | HR 91 | Ht 66.0 in | Wt 194.0 lb

## 2023-12-25 DIAGNOSIS — M545 Low back pain, unspecified: Secondary | ICD-10-CM

## 2023-12-25 DIAGNOSIS — I10 Essential (primary) hypertension: Secondary | ICD-10-CM | POA: Diagnosis not present

## 2023-12-25 DIAGNOSIS — K642 Third degree hemorrhoids: Secondary | ICD-10-CM | POA: Diagnosis not present

## 2023-12-25 DIAGNOSIS — F411 Generalized anxiety disorder: Secondary | ICD-10-CM | POA: Diagnosis not present

## 2023-12-25 DIAGNOSIS — R7303 Prediabetes: Secondary | ICD-10-CM

## 2023-12-25 DIAGNOSIS — E782 Mixed hyperlipidemia: Secondary | ICD-10-CM

## 2023-12-25 LAB — POCT URINALYSIS DIPSTICK
Bilirubin, UA: NEGATIVE
Glucose, UA: NEGATIVE
Ketones, UA: NEGATIVE
Leukocytes, UA: NEGATIVE
Nitrite, UA: NEGATIVE
Protein, UA: POSITIVE — AB
Spec Grav, UA: 1.025 (ref 1.010–1.025)
Urobilinogen, UA: 0.2 U/dL
pH, UA: 5.5 (ref 5.0–8.0)

## 2023-12-25 NOTE — Progress Notes (Signed)
 Established Patient Office Visit  Subjective:  Patient ID: Julia Diaz, female    DOB: Aug 02, 1960  Age: 63 y.o. MRN: [id number]  Chief Complaint  Patient presents with   Abdominal Pain    Patient comes in with complaints of vague abdominal pain, nonlocalized and nontender abdomen.  Patient admits that she has been constipated for the last several days.  She tries to take  OTC stool softeners but not regularly.  She also has history of hemorrhoids which are getting more uncomfortable due to her constipation.  Will set up a general surgery referral.  She has history of anxiety disorder, recently lost family members so it is worse than before.  She has a prescription of Zoloft  to take but has still not started it.  Says she will do so now. Her urine dipstick is unremarkable.  She had a CT scan of the abdomen last year for similar complaints which was negative. Patient encouraged to increase fiber in her diet, or try MiraLAX to have a good bowel movement,as her exam is benign.    No other concerns at this time.   Past Medical History:  Diagnosis Date   Arthritis    Ear infection    Hypertension    Thyroid  disease     Past Surgical History:  Procedure Laterality Date   BREAST EXCISIONAL BIOPSY Left    about 5 years ago she thinks - benign   BREAST SURGERY  02/22/2011   atypical ductyl hyperplasia of left breast   CHOLECYSTECTOMY N/A 03/05/2016   Procedure: LAPAROSCOPIC CHOLECYSTECTOMY WITH INTRAOPERATIVE CHOLANGIOGRAM;  Surgeon: Charlie FORBES Fell, MD;  Location: [facility] ORS;  Service: General;  Laterality: N/A;   JOINT REPLACEMENT Right 2010   KNEE SURGERY     right   XI ROBOTIC ASSISTED VENTRAL HERNIA N/A 07/06/2021   Procedure: XI ROBOTIC ASSISTED VENTRAL HERNIA (Incisional);  Surgeon: Rodolph Romano, MD;  Location: [facility] ORS;  Service: General;  Laterality: N/A;    Social History   Socioeconomic History   Marital status: Married    Spouse name: Not on file    Number of children: Not on file   Years of education: Not on file   Highest education level: Not on file  Occupational History   Not on file  Tobacco Use   Smoking status: Never   Smokeless tobacco: Never  Vaping Use   Vaping status: Never Used  Substance and Sexual Activity   Alcohol use: Yes    Comment: Few times a year   Drug use: No   Sexual activity: Yes    Birth control/protection: Post-menopausal  Other Topics Concern   Not on file  Social History Narrative   Not on file   Social Drivers of Health   Financial Resource Strain: Low Risk  (04/03/2023)   Received from [facility]   Overall Financial Resource Strain (CARDIA)    Difficulty of Paying Living Expenses: Not very hard  Food Insecurity: No Food Insecurity (04/03/2023)   Received from [facility]   Hunger Vital Sign    Within the past 12 months, you worried that your food would run out before you got the money to buy more.: Never true    Within the past 12 months, the food you bought just didn't last and you didn't have money to get more.: Never true  Transportation Needs: No Transportation Needs (04/03/2023)   Received from [facility] - Transportation    In  the past 12 months, has lack of transportation kept you from medical appointments or from getting medications?: No    Lack of Transportation (Non-Medical): No  Physical Activity: Not on file  Stress: Not on file  Social Connections: Not on file  Intimate Partner Violence: Not on file    Family History  Problem Relation Age of Onset   Hypertension Mother    Hypertension Father    Diabetes Father    Breast cancer Cousin     Allergies  Allergen Reactions   Aspirin Swelling    Face, eyes. Patient does not remember if breathing was affected.  Face, eyes. Patient does not remember if breathing was affected. Face, eyes. Patient does not remember if breathing was affected. Face, eyes.  Patient does not remember if breathing was affected.    Ace Inhibitors Cough    Note: cough   Amlodipine Other (See Comments)    Note: headaches    Outpatient Medications Prior to Visit  Medication Sig   clotrimazole -betamethasone  (LOTRISONE ) cream Apply topically 2 (two) times daily.   levothyroxine  (SYNTHROID ) 150 MCG tablet Take 150 mcg by mouth daily before breakfast.   losartan-hydrochlorothiazide  (HYZAAR) 100-12.5 MG tablet TAKE 1 TABLET BY MOUTH EVERY DAY   meloxicam  (MOBIC ) 15 MG tablet Take 1 tablet (15 mg total) by mouth daily.   sertraline  (ZOLOFT ) 50 MG tablet Take 1 tablet (50 mg total) by mouth daily. (Patient not taking: Reported on 12/25/2023)   No facility-administered medications prior to visit.    Review of Systems  Constitutional:  Positive for malaise/fatigue. Negative for chills, fever and weight loss.  HENT: Negative.  Negative for sore throat.   Eyes: Negative.   Respiratory: Negative.  Negative for cough and shortness of breath.   Cardiovascular: Negative.  Negative for chest pain, palpitations and leg swelling.  Gastrointestinal: Negative.  Negative for abdominal pain, blood in stool, constipation, diarrhea, heartburn, melena, nausea and vomiting.  Genitourinary:  Positive for frequency. Negative for dysuria, flank pain, hematuria and urgency.  Musculoskeletal: Negative.  Negative for joint pain and myalgias.  Skin: Negative.   Neurological: Negative.  Negative for dizziness and headaches.  Endo/Heme/Allergies: Negative.   Psychiatric/Behavioral: Negative.  Negative for depression and suicidal ideas. The patient is not nervous/anxious.        Objective:   BP 110/78   Pulse 91   Ht 5' 6 (1.676 m)   Wt 194 lb (88 kg)   SpO2 96%   BMI 31.31 kg/m   Vitals:   12/25/23 1045  BP: 110/78  Pulse: 91  Height: 5' 6 (1.676 m)  Weight: 194 lb (88 kg)  SpO2: 96%  BMI (Calculated): 31.33    Physical Exam Vitals and nursing note reviewed.   Constitutional:      Appearance: Normal appearance.  HENT:     Head: Normocephalic and atraumatic.     Nose: Nose normal.     Mouth/Throat:     Mouth: Mucous membranes are moist.     Pharynx: Oropharynx is clear.  Eyes:     Conjunctiva/sclera: Conjunctivae normal.     Pupils: Pupils are equal, round, and reactive to light.  Cardiovascular:     Rate and Rhythm: Normal rate and regular rhythm.     Pulses: Normal pulses.     Heart sounds: Normal heart sounds. No murmur heard. Pulmonary:     Effort: Pulmonary effort is normal.     Breath sounds: Normal breath sounds. No wheezing.  Abdominal:  General: Bowel sounds are normal.     Palpations: Abdomen is soft.     Tenderness: There is no abdominal tenderness. There is no right CVA tenderness or left CVA tenderness.  Musculoskeletal:        General: Normal range of motion.     Cervical back: Normal range of motion.     Right lower leg: No edema.     Left lower leg: No edema.  Skin:    General: Skin is warm and dry.  Neurological:     General: No focal deficit present.     Mental Status: She is alert and oriented to person, place, and time.  Psychiatric:        Mood and Affect: Mood normal.        Behavior: Behavior normal.      Results for orders placed or performed in visit on 12/25/23  POCT Urinalysis Dipstick (81002)  Result Value Ref Range   Color, UA Yellow    Clarity, UA Clear    Glucose, UA Negative Negative   Bilirubin, UA Negative    Ketones, UA Negative    Spec Grav, UA 1.025 1.010 - 1.025   Blood, UA Trace    pH, UA 5.5 5.0 - 8.0   Protein, UA Positive (A) Negative   Urobilinogen, UA 0.2 0.2 or 1.0 E.U./dL   Nitrite, UA Negative    Leukocytes, UA Negative Negative   Appearance Clear    Odor Yes     Recent Results (from the past 2160 hours)  POCT Urinalysis Dipstick (18997)     Status: Abnormal   Collection Time: 10/03/23  9:58 AM  Result Value Ref Range   Color, UA Yellow    Clarity, UA Clear     Glucose, UA Negative Negative   Bilirubin, UA Negative    Ketones, UA Negative    Spec Grav, UA 1.015 1.010 - 1.025   Blood, UA Small    pH, UA 7.0 5.0 - 8.0   Protein, UA Negative Negative   Urobilinogen, UA 0.2 0.2 or 1.0 E.U./dL   Nitrite, UA Negative    Leukocytes, UA Small (1+) (A) Negative   Appearance Clear    Odor No   TSH+T4F+T3Free     Status: None   Collection Time: 10/03/23 10:19 AM  Result Value Ref Range   TSH 4.090 0.450 - 4.500 uIU/mL   T3, Free 2.3 2.0 - 4.4 pg/mL   Free T4 1.54 0.82 - 1.77 ng/dL  Lipid Panel w/o Chol/HDL Ratio     Status: Abnormal   Collection Time: 10/03/23 10:19 AM  Result Value Ref Range   Cholesterol, Total 181 100 - 199 mg/dL   Triglycerides 58 0 - 149 mg/dL   HDL 57 >60 mg/dL   VLDL Cholesterol Cal 11 5 - 40 mg/dL   LDL Chol Calc (NIH) 886 (H) 0 - 99 mg/dL  RFE85+ZHQM     Status: None   Collection Time: 10/03/23 10:19 AM  Result Value Ref Range   Glucose 90 70 - 99 mg/dL   BUN 21 8 - 27 mg/dL   Creatinine, Ser 9.24 0.57 - 1.00 mg/dL   eGFR 89 >40 fO/fpw/8.26   BUN/Creatinine Ratio 28 12 - 28   Sodium 141 134 - 144 mmol/L   Potassium 4.4 3.5 - 5.2 mmol/L   Chloride 103 96 - 106 mmol/L   CO2 25 20 - 29 mmol/L   Calcium 9.8 8.7 - 10.3 mg/dL   Total Protein 7.0 6.0 -  8.5 g/dL   Albumin 4.1 3.9 - 4.9 g/dL   Globulin, Total 2.9 1.5 - 4.5 g/dL   Bilirubin Total 0.5 0.0 - 1.2 mg/dL   Alkaline Phosphatase 81 44 - 121 IU/L   AST 20 0 - 40 IU/L   ALT 22 0 - 32 IU/L  Hemoglobin A1c     Status: Abnormal   Collection Time: 10/03/23 10:19 AM  Result Value Ref Range   Hgb A1c MFr Bld 5.8 (H) 4.8 - 5.6 %    Comment:          Prediabetes: 5.7 - 6.4          Diabetes: >6.4          Glycemic control for adults with diabetes: <7.0    Est. average glucose Bld gHb Est-mCnc 120 mg/dL  Urine Culture     Status: None   Collection Time: 10/03/23  2:18 PM   Specimen: Urine   UR  Result Value Ref Range   Urine Culture, Routine Final report     Organism ID, Bacteria No growth   POCT Urinalysis Dipstick (18997)     Status: Abnormal   Collection Time: 12/25/23 11:35 AM  Result Value Ref Range   Color, UA Yellow    Clarity, UA Clear    Glucose, UA Negative Negative   Bilirubin, UA Negative    Ketones, UA Negative    Spec Grav, UA 1.025 1.010 - 1.025   Blood, UA Trace    pH, UA 5.5 5.0 - 8.0   Protein, UA Positive (A) Negative   Urobilinogen, UA 0.2 0.2 or 1.0 E.U./dL   Nitrite, UA Negative    Leukocytes, UA Negative Negative   Appearance Clear    Odor Yes       Assessment & Plan:  To continue her current medications.  Referral sent for hemorrhoids.  Patient encouraged to increase fiber in her diet as well as to add stool softeners.  Increased water intake emphasized. Problem List Items Addressed This Visit     Essential hypertension   Hemorrhoids   Relevant Orders   Ambulatory referral to General Surgery   Prediabetes   Mixed hyperlipidemia   Other Visit Diagnoses       Low back pain, unspecified back pain laterality, unspecified chronicity, unspecified whether sciatica present    -  Primary   Relevant Orders   POCT Urinalysis Dipstick (18997) (Completed)     GAD (generalized anxiety disorder)           Return in about 3 weeks (around 01/15/2024).   Total time spent: 30 minutes  FERNAND FREDY RAMAN, MD  12/25/2023   This document may have been prepared by [facility] Voice Recognition software and as such may include unintentional dictation errors.

## 2024-01-16 ENCOUNTER — Ambulatory Visit: Admitting: Internal Medicine

## 2024-01-23 ENCOUNTER — Ambulatory Visit: Admitting: Internal Medicine

## 2024-01-28 ENCOUNTER — Other Ambulatory Visit: Payer: Self-pay | Admitting: Internal Medicine

## 2024-01-28 DIAGNOSIS — I10 Essential (primary) hypertension: Secondary | ICD-10-CM

## 2024-02-06 ENCOUNTER — Ambulatory Visit: Admitting: Internal Medicine

## 2024-03-24 ENCOUNTER — Other Ambulatory Visit: Payer: Self-pay | Admitting: Internal Medicine

## 2024-03-24 DIAGNOSIS — Z1231 Encounter for screening mammogram for malignant neoplasm of breast: Secondary | ICD-10-CM

## 2024-04-16 ENCOUNTER — Ambulatory Visit
Admission: RE | Admit: 2024-04-16 | Discharge: 2024-04-16 | Disposition: A | Discharge status: Home / Self Care | Payer: Self-pay | Source: Ambulatory Visit | Attending: Internal Medicine | Admitting: Internal Medicine

## 2024-04-16 DIAGNOSIS — Z1231 Encounter for screening mammogram for malignant neoplasm of breast: Secondary | ICD-10-CM

## 2024-04-19 ENCOUNTER — Ambulatory Visit

## 2024-04-19 ENCOUNTER — Ambulatory Visit: Payer: Self-pay

## 2024-04-29 ENCOUNTER — Other Ambulatory Visit: Payer: Self-pay | Admitting: Cardiology

## 2024-04-29 DIAGNOSIS — I10 Essential (primary) hypertension: Secondary | ICD-10-CM

## 2024-05-17 ENCOUNTER — Ambulatory Visit
Admission: RE | Admit: 2024-05-17 | Discharge: 2024-05-17 | Disposition: A | Discharge status: Home / Self Care | Source: Ambulatory Visit | Attending: Internal Medicine | Admitting: Internal Medicine

## 2024-06-18 ENCOUNTER — Ambulatory Visit: Admitting: Internal Medicine

## 2024-07-02 ENCOUNTER — Ambulatory Visit: Payer: Self-pay | Admitting: Internal Medicine

## 2024-07-02 ENCOUNTER — Encounter: Payer: Self-pay | Admitting: Internal Medicine

## 2024-07-02 ENCOUNTER — Ambulatory Visit: Admitting: Internal Medicine

## 2024-07-02 VITALS — BP 120/64 | HR 72 | Ht 66.0 in | Wt 190.8 lb

## 2024-07-02 DIAGNOSIS — J101 Influenza due to other identified influenza virus with other respiratory manifestations: Secondary | ICD-10-CM | POA: Diagnosis not present

## 2024-07-02 DIAGNOSIS — I10 Essential (primary) hypertension: Secondary | ICD-10-CM

## 2024-07-02 DIAGNOSIS — Z6831 Body mass index (BMI) 31.0-31.9, adult: Secondary | ICD-10-CM | POA: Diagnosis not present

## 2024-07-02 DIAGNOSIS — E66811 Obesity, class 1: Secondary | ICD-10-CM | POA: Diagnosis not present

## 2024-07-02 DIAGNOSIS — R002 Palpitations: Secondary | ICD-10-CM

## 2024-07-02 DIAGNOSIS — I340 Nonrheumatic mitral (valve) insufficiency: Secondary | ICD-10-CM

## 2024-07-02 DIAGNOSIS — R10A3 Flank pain, bilateral: Secondary | ICD-10-CM | POA: Insufficient documentation

## 2024-07-02 DIAGNOSIS — R7303 Prediabetes: Secondary | ICD-10-CM | POA: Diagnosis not present

## 2024-07-02 DIAGNOSIS — R5383 Other fatigue: Secondary | ICD-10-CM | POA: Diagnosis not present

## 2024-07-02 DIAGNOSIS — E6609 Other obesity due to excess calories: Secondary | ICD-10-CM | POA: Diagnosis not present

## 2024-07-02 DIAGNOSIS — R319 Hematuria, unspecified: Secondary | ICD-10-CM

## 2024-07-02 DIAGNOSIS — K219 Gastro-esophageal reflux disease without esophagitis: Secondary | ICD-10-CM

## 2024-07-02 DIAGNOSIS — Z1389 Encounter for screening for other disorder: Secondary | ICD-10-CM | POA: Insufficient documentation

## 2024-07-02 DIAGNOSIS — E782 Mixed hyperlipidemia: Secondary | ICD-10-CM

## 2024-07-02 LAB — POCT URINALYSIS DIPSTICK
Bilirubin, UA: NEGATIVE
Glucose, UA: NEGATIVE
Ketones, UA: NEGATIVE
Leukocytes, UA: NEGATIVE
Nitrite, UA: NEGATIVE
Protein, UA: POSITIVE — AB
Spec Grav, UA: 1.02
Urobilinogen, UA: 0.2 U/dL
pH, UA: 7

## 2024-07-02 NOTE — Addendum Note (Signed)
 Addended by: FERNAND FREDY RAMAN on: 07/02/2024 01:59 PM   Modules accepted: Orders

## 2024-07-02 NOTE — Progress Notes (Signed)
 "  Established Patient Office Visit  Subjective:  Patient ID: Julia Diaz, female    DOB: 09-12-1960  Age: 64 y.o. MRN: [id number]  No chief complaint on file.   Patient is here today for acute visit. She reports feeling unwell at this time. She tested positive for flu A with home test on 06/30/24. She reports her symptoms began Monday night.  She reports feeling dizzy and weak today. She reports yesterday was the last day she felt like she had chills. She is having pain in her lungs that is worse when she lays down. She describes it more as lower back/ flank pain upon exam. Denies shortness of breath, denies chest pain. Reports palpitations. Pulse is irregular today. She reports not eating and drinking much this week as she has been sick. Last EKG was 09/2016. Will get EKG completed today. Will check UA today as well to rule out UTI with location of pain she is describing. Order future echo to be updated as well. UA was unremarkable for UTI however trace-lysed blood will send to lab for UA and culture as needed to determine if she is having kidney stone.  She has been taking OTC Robitussin cold for symptoms relief. Advised patient to take dayquil or nyquil or mucinex flu/cold for symptom relief in addition to tylenol  or ibuprofen for fever, aches and pains. Advised patient to stay hydrated with sugar free Gatorade or pedialyte. Patient is out of window to have Tamiflu.  She is due for routine blood work and preventative screenings to be updated. Will have patient come back when she is feeling well to get labs completed.    No other concerns at this time.   Past Medical History:  Diagnosis Date   Arthritis    Ear infection    Hypertension    Thyroid  disease     Past Surgical History:  Procedure Laterality Date   BREAST EXCISIONAL BIOPSY Left    about 5 years ago she thinks - benign   BREAST SURGERY  02/22/2011   atypical ductyl hyperplasia of left breast   CHOLECYSTECTOMY N/A  03/05/2016   Procedure: LAPAROSCOPIC CHOLECYSTECTOMY WITH INTRAOPERATIVE CHOLANGIOGRAM;  Surgeon: Charlie FORBES Fell, MD;  Location: [facility] ORS;  Service: General;  Laterality: N/A;   JOINT REPLACEMENT Right 2010   KNEE SURGERY     right   XI ROBOTIC ASSISTED VENTRAL HERNIA N/A 07/06/2021   Procedure: XI ROBOTIC ASSISTED VENTRAL HERNIA (Incisional);  Surgeon: Rodolph Romano, MD;  Location: [facility] ORS;  Service: General;  Laterality: N/A;    Social History   Socioeconomic History   Marital status: Married    Spouse name: Not on file   Number of children: Not on file   Years of education: Not on file   Highest education level: Not on file  Occupational History   Not on file  Tobacco Use   Smoking status: Never   Smokeless tobacco: Never  Vaping Use   Vaping status: Never Used  Substance and Sexual Activity   Alcohol use: Yes    Comment: Few times a year   Drug use: No   Sexual activity: Yes    Birth control/protection: Post-menopausal  Other Topics Concern   Not on file  Social History Narrative   Not on file   Social Drivers of Health   Tobacco Use: Low Risk  (04/19/2024)   Received from [facility]   Patient History    Smoking Tobacco Use: Never    Smokeless Tobacco  Use: Never    Passive Exposure: Never  Physicist, Medical Strain: Low Risk  (04/03/2023)   Received from [facility]   Overall Financial Resource Strain (CARDIA)    Difficulty of Paying Living Expenses: Not very hard  Food Insecurity: No Food Insecurity (04/03/2023)   Received from [facility]   Epic    Within the past 12 months, you worried that your food would run out before you got the money to buy more.: Never true    Within the past 12 months, the food you bought just didn't last and you didn't have money to get more.: Never true  Transportation Needs: No Transportation Needs (04/03/2023)   Received from [facility]  - Transportation    In the past 12 months, has lack of transportation kept you from medical appointments or from getting medications?: No    Lack of Transportation (Non-Medical): No  Physical Activity: Not on file  Stress: Not on file  Social Connections: Not on file  Intimate Partner Violence: Not on file  Depression (PHQ2-9): Medium Risk (10/03/2023)   Depression (PHQ2-9)    PHQ-2 Score: 8  Alcohol Screen: Not on file  Housing: Low Risk  (11/21/2023)   Received from [facility]   Epic    In the last 12 months, was there a time when you were not able to pay the mortgage or rent on time?: No    In the past 12 months, how many times have you moved where you were living?: 0    At any time in the past 12 months, were you homeless or living in a shelter (including now)?: No  Utilities: Not At Risk (04/03/2023)   Received from [facility] Utilities    Threatened with loss of utilities: No  Health Literacy: Not on file    Family History  Problem Relation Age of Onset   Hypertension Mother    Hypertension Father    Diabetes Father    Breast cancer Cousin     Allergies[1]  Show/hide medication list[2]  Review of Systems  Constitutional:  Positive for malaise/fatigue. Negative for chills and fever.  HENT:  Positive for congestion. Negative for sore throat.   Eyes: Negative.  Negative for blurred vision and pain.  Respiratory:  Positive for cough and sputum production. Negative for shortness of breath.   Cardiovascular:  Positive for palpitations. Negative for chest pain and leg swelling.  Gastrointestinal: Negative.  Negative for abdominal pain, blood in stool, constipation, diarrhea, heartburn, melena, nausea and vomiting.  Genitourinary:  Positive for flank pain (bilateral). Negative for dysuria, frequency and urgency.  Musculoskeletal:  Positive for back pain (lumbar). Negative for joint pain and myalgias.  Skin: Negative.    Neurological:  Positive for dizziness. Negative for tingling, sensory change, weakness and headaches.  Endo/Heme/Allergies: Negative.   Psychiatric/Behavioral: Negative.  Negative for depression and suicidal ideas. The patient is not nervous/anxious.        Objective:   BP 120/64 (BP Location: Right Arm, Patient Position: Sitting)   Pulse 72   Wt 190 lb 12.8 oz (86.5 kg)   SpO2 95%   BMI 30.80 kg/m   Vitals:   07/02/24 0950  BP: 120/64  Pulse: 72  Weight: 190 lb 12.8 oz (86.5 kg)  SpO2: 95%    Physical Exam Vitals and nursing note reviewed.  Constitutional:      Appearance: Normal appearance.  HENT:  Head: Normocephalic and atraumatic.     Nose: Nose normal.     Mouth/Throat:     Mouth: Mucous membranes are moist.     Pharynx: Oropharynx is clear.  Eyes:     Conjunctiva/sclera: Conjunctivae normal.     Pupils: Pupils are equal, round, and reactive to light.  Cardiovascular:     Rate and Rhythm: Normal rate. Rhythm irregular.     Heart sounds: Murmur heard.  Pulmonary:     Effort: Pulmonary effort is normal.     Breath sounds: Normal breath sounds. No wheezing.  Abdominal:     General: Bowel sounds are normal.     Palpations: Abdomen is soft.     Tenderness: There is no abdominal tenderness. There is no right CVA tenderness or left CVA tenderness.  Musculoskeletal:        General: Normal range of motion.     Cervical back: Normal range of motion.     Right lower leg: 1+ Pitting Edema present.     Left lower leg: 1+ Pitting Edema present.  Skin:    General: Skin is warm and dry.     Coloration: Skin is pale.  Neurological:     General: No focal deficit present.     Mental Status: She is alert and oriented to person, place, and time.  Psychiatric:        Mood and Affect: Mood normal.        Behavior: Behavior normal.      No results found for any visits on 07/02/24.  No results found for this or any previous visit (from the past 2160 hours).     Assessment & Plan:  Check EKG, UA today. Echo ordered. Check routine labs when patient is feeling well. Continue medications as prescribed. Recommend electrolyte replacement and rest. OTC symptom relief treatment as needed. Patient is outside window for tamiflu.  EKG showed NSR. Problem List Items Addressed This Visit       Cardiovascular and Mediastinum   Nonrheumatic mitral valve regurgitation   Relevant Orders   CBC with Diff   Essential hypertension - Primary   Relevant Orders   CMP14+EGFR   CBC with Diff     Respiratory   Influenza A H1N1 infection     Digestive   Gastroesophageal reflux disease without esophagitis     Other   Palpitations   Relevant Orders   EKG 12-Lead   CBC with Diff   PCV ECHOCARDIOGRAM COMPLETE   Prediabetes   Relevant Orders   Hemoglobin A1c   Mixed hyperlipidemia   Relevant Orders   Lipid panel   CMP14+EGFR   CBC with Diff   Screening for blood or protein in urine   Fatigue   Relevant Orders   VITAMIN D 25 Hydroxy (Vit-D Deficiency, Fractures)   TSH   Vitamin B12   Class 1 obesity due to excess calories with serious comorbidity and body mass index (BMI) of 31.0 to 31.9 in adult   Flank pain, bilateral   Relevant Orders   POCT Urinalysis Dipstick (18997)    Return in about 3 weeks (around 07/23/2024).   Total time spent: 30 minutes. This time includes review of previous notes and results and patient face to face interaction during today's visit.    FERNAND FREDY RAMAN, MD  07/02/2024   This document may have been prepared by [facility] Voice Recognition software and as such may include unintentional dictation errors.      [1]  Allergies Allergen Reactions  Aspirin Swelling    Face, eyes. Patient does not remember if breathing was affected.  Face, eyes. Patient does not remember if breathing was affected. Face, eyes. Patient does not remember if breathing was affected. Face, eyes. Patient does not remember if breathing was  affected.    Ace Inhibitors Cough    Note: cough   Amlodipine Other (See Comments)    Note: headaches  [2]  Outpatient Medications Prior to Visit  Medication Sig   clotrimazole -betamethasone  (LOTRISONE ) cream Apply topically 2 (two) times daily.   levothyroxine  (SYNTHROID ) 150 MCG tablet Take 150 mcg by mouth daily before breakfast.   losartan-hydrochlorothiazide  (HYZAAR) 100-12.5 MG tablet TAKE 1 TABLET BY MOUTH EVERY DAY   meloxicam  (MOBIC ) 15 MG tablet Take 1 tablet (15 mg total) by mouth daily.   sertraline  (ZOLOFT ) 50 MG tablet Take 1 tablet (50 mg total) by mouth daily. (Patient not taking: Reported on 12/25/2023)   No facility-administered medications prior to visit.   "

## 2024-07-16 ENCOUNTER — Ambulatory Visit

## 2024-07-16 DIAGNOSIS — I351 Nonrheumatic aortic (valve) insufficiency: Secondary | ICD-10-CM | POA: Diagnosis not present

## 2024-07-16 DIAGNOSIS — I361 Nonrheumatic tricuspid (valve) insufficiency: Secondary | ICD-10-CM | POA: Diagnosis not present

## 2024-07-16 DIAGNOSIS — I340 Nonrheumatic mitral (valve) insufficiency: Secondary | ICD-10-CM

## 2024-07-16 DIAGNOSIS — R002 Palpitations: Secondary | ICD-10-CM

## 2024-07-23 ENCOUNTER — Encounter: Payer: Self-pay | Admitting: Internal Medicine

## 2024-07-23 ENCOUNTER — Ambulatory Visit: Admitting: Internal Medicine

## 2024-07-23 VITALS — BP 118/86 | HR 68 | Ht 66.0 in | Wt 193.0 lb

## 2024-07-23 DIAGNOSIS — Z13820 Encounter for screening for osteoporosis: Secondary | ICD-10-CM | POA: Diagnosis not present

## 2024-07-23 DIAGNOSIS — R7303 Prediabetes: Secondary | ICD-10-CM

## 2024-07-23 DIAGNOSIS — E038 Other specified hypothyroidism: Secondary | ICD-10-CM | POA: Diagnosis not present

## 2024-07-23 DIAGNOSIS — I10 Essential (primary) hypertension: Secondary | ICD-10-CM

## 2024-07-23 DIAGNOSIS — R0602 Shortness of breath: Secondary | ICD-10-CM | POA: Diagnosis not present

## 2024-07-23 DIAGNOSIS — E782 Mixed hyperlipidemia: Secondary | ICD-10-CM

## 2024-08-09 ENCOUNTER — Encounter

## 2024-08-13 ENCOUNTER — Institutional Professional Consult (permissible substitution): Admitting: Cardiovascular Disease

## 2024-08-23 ENCOUNTER — Ambulatory Visit: Admitting: Internal Medicine
